# Patient Record
Sex: Female | Born: 1970 | Race: Black or African American | Hispanic: No | Marital: Married | Smoking: Never smoker
Health system: Southern US, Community
[De-identification: ages and names within clinical notes are randomized; demographics above are authoritative.]

## PROBLEM LIST (undated history)

## (undated) ENCOUNTER — Inpatient Hospital Stay (HOSPITAL_COMMUNITY): Payer: Self-pay

## (undated) DIAGNOSIS — E7800 Pure hypercholesterolemia, unspecified: Secondary | ICD-10-CM

## (undated) DIAGNOSIS — Z6841 Body Mass Index (BMI) 40.0 and over, adult: Secondary | ICD-10-CM

## (undated) DIAGNOSIS — L732 Hidradenitis suppurativa: Secondary | ICD-10-CM

## (undated) DIAGNOSIS — F419 Anxiety disorder, unspecified: Secondary | ICD-10-CM

## (undated) DIAGNOSIS — E559 Vitamin D deficiency, unspecified: Secondary | ICD-10-CM

## (undated) DIAGNOSIS — I10 Essential (primary) hypertension: Secondary | ICD-10-CM

## (undated) DIAGNOSIS — Z86018 Personal history of other benign neoplasm: Secondary | ICD-10-CM

## (undated) DIAGNOSIS — K439 Ventral hernia without obstruction or gangrene: Secondary | ICD-10-CM

## (undated) DIAGNOSIS — D219 Benign neoplasm of connective and other soft tissue, unspecified: Secondary | ICD-10-CM

## (undated) DIAGNOSIS — N3281 Overactive bladder: Secondary | ICD-10-CM

## (undated) DIAGNOSIS — E669 Obesity, unspecified: Secondary | ICD-10-CM

## (undated) DIAGNOSIS — D649 Anemia, unspecified: Secondary | ICD-10-CM

## (undated) HISTORY — DX: Ventral hernia without obstruction or gangrene: K43.9

## (undated) HISTORY — DX: Pure hypercholesterolemia, unspecified: E78.00

## (undated) HISTORY — PX: THROAT SURGERY: SHX803

## (undated) HISTORY — DX: Anxiety disorder, unspecified: F41.9

## (undated) HISTORY — DX: Overactive bladder: N32.81

## (undated) HISTORY — DX: Vitamin D deficiency, unspecified: E55.9

## (undated) HISTORY — DX: Obesity, unspecified: E66.9

## (undated) HISTORY — DX: Personal history of other benign neoplasm: Z86.018

## (undated) HISTORY — DX: Body Mass Index (BMI) 40.0 and over, adult: Z684

## (undated) HISTORY — DX: Hidradenitis suppurativa: L73.2

---

## 2010-03-08 ENCOUNTER — Emergency Department (HOSPITAL_COMMUNITY): Admission: EM | Admit: 2010-03-08 | Discharge: 2010-03-08 | Payer: Self-pay | Admitting: Emergency Medicine

## 2011-02-22 ENCOUNTER — Emergency Department (HOSPITAL_COMMUNITY)
Admission: EM | Admit: 2011-02-22 | Discharge: 2011-02-22 | Disposition: A | Discharge status: Home / Self Care | Payer: Self-pay | Attending: Emergency Medicine | Admitting: Emergency Medicine

## 2011-02-22 DIAGNOSIS — K625 Hemorrhage of anus and rectum: Secondary | ICD-10-CM | POA: Insufficient documentation

## 2011-02-22 DIAGNOSIS — I10 Essential (primary) hypertension: Secondary | ICD-10-CM | POA: Insufficient documentation

## 2011-02-22 DIAGNOSIS — K644 Residual hemorrhoidal skin tags: Secondary | ICD-10-CM | POA: Insufficient documentation

## 2011-02-22 LAB — OCCULT BLOOD, POC DEVICE: Fecal Occult Bld: NEGATIVE

## 2011-02-22 LAB — POCT I-STAT, CHEM 8
Creatinine, Ser: 0.7 mg/dL (ref 0.50–1.10)
HCT: 35 % — ABNORMAL LOW (ref 36.0–46.0)
Hemoglobin: 11.9 g/dL — ABNORMAL LOW (ref 12.0–15.0)
Potassium: 3.4 mEq/L — ABNORMAL LOW (ref 3.5–5.1)
Sodium: 138 mEq/L (ref 135–145)
TCO2: 24 mmol/L (ref 0–100)

## 2011-08-31 ENCOUNTER — Encounter (INDEPENDENT_AMBULATORY_CARE_PROVIDER_SITE_OTHER): Payer: BC Managed Care – PPO | Admitting: Obstetrics and Gynecology

## 2011-08-31 DIAGNOSIS — R21 Rash and other nonspecific skin eruption: Secondary | ICD-10-CM

## 2011-08-31 DIAGNOSIS — I10 Essential (primary) hypertension: Secondary | ICD-10-CM

## 2011-09-01 ENCOUNTER — Other Ambulatory Visit: Payer: Self-pay | Admitting: Obstetrics and Gynecology

## 2011-09-01 DIAGNOSIS — Z1231 Encounter for screening mammogram for malignant neoplasm of breast: Secondary | ICD-10-CM

## 2011-09-08 ENCOUNTER — Ambulatory Visit: Payer: BC Managed Care – PPO

## 2013-10-19 ENCOUNTER — Other Ambulatory Visit (HOSPITAL_COMMUNITY): Admission: RE | Admit: 2013-10-19 | Payer: BC Managed Care – PPO | Source: Ambulatory Visit | Admitting: Family Medicine

## 2013-10-19 ENCOUNTER — Other Ambulatory Visit: Payer: Self-pay | Admitting: Family Medicine

## 2013-10-19 DIAGNOSIS — Z124 Encounter for screening for malignant neoplasm of cervix: Secondary | ICD-10-CM

## 2013-11-10 ENCOUNTER — Inpatient Hospital Stay (HOSPITAL_COMMUNITY): Admit: 2013-11-10 | Payer: Self-pay

## 2014-05-08 ENCOUNTER — Encounter (HOSPITAL_COMMUNITY): Payer: Self-pay | Admitting: Emergency Medicine

## 2014-05-08 ENCOUNTER — Emergency Department (HOSPITAL_COMMUNITY)
Admission: EM | Admit: 2014-05-08 | Discharge: 2014-05-08 | Disposition: A | Discharge status: Home / Self Care | Payer: BC Managed Care – PPO | Attending: Emergency Medicine | Admitting: Emergency Medicine

## 2014-05-08 DIAGNOSIS — O039 Complete or unspecified spontaneous abortion without complication: Secondary | ICD-10-CM | POA: Diagnosis not present

## 2014-05-08 DIAGNOSIS — Z79899 Other long term (current) drug therapy: Secondary | ICD-10-CM | POA: Insufficient documentation

## 2014-05-08 DIAGNOSIS — Z862 Personal history of diseases of the blood and blood-forming organs and certain disorders involving the immune mechanism: Secondary | ICD-10-CM | POA: Diagnosis not present

## 2014-05-08 DIAGNOSIS — Z3A08 8 weeks gestation of pregnancy: Secondary | ICD-10-CM | POA: Diagnosis not present

## 2014-05-08 DIAGNOSIS — I10 Essential (primary) hypertension: Secondary | ICD-10-CM | POA: Insufficient documentation

## 2014-05-08 DIAGNOSIS — N939 Abnormal uterine and vaginal bleeding, unspecified: Secondary | ICD-10-CM | POA: Diagnosis present

## 2014-05-08 HISTORY — DX: Essential (primary) hypertension: I10

## 2014-05-08 HISTORY — DX: Anemia, unspecified: D64.9

## 2014-05-08 LAB — CBC WITH DIFFERENTIAL/PLATELET
BASOS ABS: 0 10*3/uL (ref 0.0–0.1)
BASOS PCT: 0 % (ref 0–1)
EOS PCT: 2 % (ref 0–5)
Eosinophils Absolute: 0.1 10*3/uL (ref 0.0–0.7)
HEMATOCRIT: 37.9 % (ref 36.0–46.0)
Hemoglobin: 12.4 g/dL (ref 12.0–15.0)
LYMPHS PCT: 27 % (ref 12–46)
Lymphs Abs: 1.2 10*3/uL (ref 0.7–4.0)
MCH: 25.9 pg — ABNORMAL LOW (ref 26.0–34.0)
MCHC: 32.7 g/dL (ref 30.0–36.0)
MCV: 79.1 fL (ref 78.0–100.0)
MONO ABS: 0.4 10*3/uL (ref 0.1–1.0)
MONOS PCT: 9 % (ref 3–12)
Neutro Abs: 2.9 10*3/uL (ref 1.7–7.7)
Neutrophils Relative %: 62 % (ref 43–77)
Platelets: 318 10*3/uL (ref 150–400)
RBC: 4.79 MIL/uL (ref 3.87–5.11)
RDW: 14.7 % (ref 11.5–15.5)
WBC: 4.6 10*3/uL (ref 4.0–10.5)

## 2014-05-08 LAB — BASIC METABOLIC PANEL
Anion gap: 16 — ABNORMAL HIGH (ref 5–15)
BUN: 12 mg/dL (ref 6–23)
CHLORIDE: 99 meq/L (ref 96–112)
CO2: 21 mEq/L (ref 19–32)
CREATININE: 0.85 mg/dL (ref 0.50–1.10)
Calcium: 9.8 mg/dL (ref 8.4–10.5)
GFR calc non Af Amer: 83 mL/min — ABNORMAL LOW (ref >90)
GLUCOSE: 118 mg/dL — AB (ref 70–99)
Potassium: 4 mEq/L (ref 3.7–5.3)
Sodium: 136 mEq/L — ABNORMAL LOW (ref 137–147)

## 2014-05-08 LAB — HCG, QUANTITATIVE, PREGNANCY: hCG, Beta Chain, Quant, S: 558 m[IU]/mL — ABNORMAL HIGH (ref <5)

## 2014-05-08 MED ORDER — NAPROXEN 500 MG PO TABS
500.0000 mg | ORAL_TABLET | Freq: Once | ORAL | Status: AC
  Administered 2014-05-08: 500 mg via ORAL
  Filled 2014-05-08: qty 1

## 2014-05-08 MED ORDER — ONDANSETRON 8 MG PO TBDP
8.0000 mg | ORAL_TABLET | Freq: Once | ORAL | Status: AC
  Administered 2014-05-08: 8 mg via ORAL
  Filled 2014-05-08: qty 1

## 2014-05-08 MED ORDER — HYDROMORPHONE HCL 1 MG/ML IJ SOLN
1.0000 mg | Freq: Once | INTRAMUSCULAR | Status: AC
  Administered 2014-05-08: 1 mg via INTRAMUSCULAR
  Filled 2014-05-08: qty 1

## 2014-05-08 MED ORDER — OXYCODONE-ACETAMINOPHEN 5-325 MG PO TABS
2.0000 | ORAL_TABLET | Freq: Once | ORAL | Status: AC
  Administered 2014-05-08: 2 via ORAL
  Filled 2014-05-08: qty 2

## 2014-05-08 MED ORDER — NAPROXEN 500 MG PO TABS: 500.0000 mg | ORAL_TABLET | Freq: Two times a day (BID) | ORAL | Status: DC

## 2014-05-08 MED ORDER — OXYCODONE-ACETAMINOPHEN 5-325 MG PO TABS: 2.0000 | ORAL_TABLET | ORAL | Status: DC | PRN

## 2014-05-08 MED ORDER — ONDANSETRON 8 MG PO TBDP: 8.0000 mg | ORAL_TABLET | Freq: Three times a day (TID) | ORAL | Status: DC | PRN

## 2014-05-08 NOTE — ED Notes (Signed)
Patient states she was seen Saturday and was told she was having a miscarriage. Patient states she began having heavy bleeding and passing tissue. Patient states the bleeding has been increasing and now she is having severe abd cramping. Patient states she took 800mg  ibuprofen at 0300 and also drank Miralax for constipation. Patient is A&O, NAD noted. Patient husband at bedside.

## 2014-05-08 NOTE — ED Provider Notes (Signed)
CSN: [id number]     Arrival date & time 05/08/14  0353 History   First MD Initiated Contact with Patient 05/08/14 0424     Chief Complaint  Patient presents with  . Vaginal Bleeding     (Consider location/radiation/quality/duration/timing/severity/associated sxs/prior Treatment) HPI 43 year old female presents to the emergency department with complaint of vaginal bleeding and lower abdominal cramping.  Patient was seen Saturday, told she was having a miscarriage, had a ultrasound showing a 8 week 5 day IUP with no heart activity at [facility].  She has been having bleeding for the last 3 days, tonight much heavier.  She was seen at the clinic on Monday and had hCG done.  No fevers or chills.  This is her first pregnancy.  She is not bleeding more than a pad an hour Past Medical History  Diagnosis Date  . Hypertension   . Anemia    History reviewed. No pertinent past surgical history. No family history on file. History  Substance Use Topics  . Smoking status: Never Smoker   . Smokeless tobacco: Not on file  . Alcohol Use: No   OB History    Gravida Para Term Preterm AB TAB SAB Ectopic Multiple Living   1              Review of Systems  See History of Present Illness; otherwise all other systems are reviewed and negative   Allergies  Review of patient's allergies indicates no known allergies.  Home Medications   Prior to Admission medications   Medication Sig Start Date End Date Taking? Authorizing Provider  labetalol (NORMODYNE) 200 MG tablet Take 1 tablet by mouth 2 (two) times daily. 05/04/14   Historical Provider, MD   BP 156/84 mmHg  Pulse 87  Temp(Src) 97.7 F (36.5 C) (Oral)  Resp 20  SpO2 98%  LMP  Physical Exam  Constitutional: She is oriented to person, place, and time. She appears well-developed and well-nourished.  HENT:  Head: Normocephalic and atraumatic.  Nose: Nose normal.  Mouth/Throat: Oropharynx is clear and moist.  Eyes: Conjunctivae and  EOM are normal. Pupils are equal, round, and reactive to light.  Neck: Normal range of motion. Neck supple. No JVD present. No tracheal deviation present. No thyromegaly present.  Cardiovascular: Normal rate, regular rhythm, normal heart sounds and intact distal pulses.  Exam reveals no gallop and no friction rub.   No murmur heard. Pulmonary/Chest: Effort normal and breath sounds normal. No stridor. No respiratory distress. She has no wheezes. She has no rales. She exhibits no tenderness.  Abdominal: Soft. Bowel sounds are normal. She exhibits no distension and no mass. There is no tenderness. There is no rebound and no guarding.  Genitourinary:  External genitalia within normal limits Vagina with bloody discharge Cervix  Os open, blood, tissue noted in os negative for cervical motion tenderness Adnexa palpated, no masses or negative for tenderness noted Bladder palpated positive for tenderness Uterus palpated no masses but positive for tenderness    Musculoskeletal: Normal range of motion. She exhibits no edema or tenderness.  Lymphadenopathy:    She has no cervical adenopathy.  Neurological: She is alert and oriented to person, place, and time. She displays normal reflexes. She exhibits normal muscle tone. Coordination normal.  Skin: Skin is warm and dry. No rash noted. No erythema. No pallor.  Psychiatric: She has a normal mood and affect. Her behavior is normal. Judgment and thought content normal.  Nursing note and vitals reviewed.   ED  Course  Procedures (including critical care time) Labs Review Labs Reviewed  CBC WITH DIFFERENTIAL - Abnormal; Notable for the following:    MCH 25.9 (*)    All other components within normal limits  HCG, QUANTITATIVE, PREGNANCY - Abnormal; Notable for the following:    hCG, Beta Chain, Quant, S 558 (*)    All other components within normal limits  BASIC METABOLIC PANEL - Abnormal; Notable for the following:    Sodium 136 (*)    Glucose, Bld  118 (*)    GFR calc non Af Amer 83 (*)    Anion gap 16 (*)    All other components within normal limits    Imaging Review No results found.   EKG Interpretation None      MDM   Final diagnoses:  Miscarriage    [name] with miscarriage.  She is O+ blood type.  HCG is 558, do not have values from [facility] [place] [facility].  Patient has known IUP.  We'll can try to control pain, however will need follow back up with OB/GYN.    Kalman Drape, MD 05/08/14 [phone]

## 2014-05-08 NOTE — ED Notes (Signed)
Pelvic exam ready. Lab at bedside.

## 2014-05-08 NOTE — Discharge Instructions (Signed)
Miscarriage A miscarriage is the sudden loss of an unborn baby (fetus) before the 20th week of pregnancy. Most miscarriages happen in the first 3 months of pregnancy. Sometimes, it happens before a woman even knows she is pregnant. A miscarriage is also called a "spontaneous miscarriage" or "early pregnancy loss." Having a miscarriage can be an emotional experience. Talk with your caregiver about any questions you may have about miscarrying, the grieving process, and your future pregnancy plans. CAUSES   Problems with the fetal chromosomes that make it impossible for the baby to develop normally. Problems with the baby's genes or chromosomes are most often the result of errors that occur, by chance, as the embryo divides and grows. The problems are not inherited from the parents.  Infection of the cervix or uterus.   Hormone problems.   Problems with the cervix, such as having an incompetent cervix. This is when the tissue in the cervix is not strong enough to hold the pregnancy.   Problems with the uterus, such as an abnormally shaped uterus, uterine fibroids, or congenital abnormalities.   Certain medical conditions.   Smoking, drinking alcohol, or taking illegal drugs.   Trauma.  Often, the cause of a miscarriage is unknown.  SYMPTOMS   Vaginal bleeding or spotting, with or without cramps or pain.  Pain or cramping in the abdomen or lower back.  Passing fluid, tissue, or blood clots from the vagina. DIAGNOSIS  Your caregiver will perform a physical exam. You may also have an ultrasound to confirm the miscarriage. Blood or urine tests may also be ordered. TREATMENT   Sometimes, treatment is not necessary if you naturally pass all the fetal tissue that was in the uterus. If some of the fetus or placenta remains in the body (incomplete miscarriage), tissue left behind may become infected and must be removed. Usually, a dilation and curettage (D and C) procedure is performed.  During a D and C procedure, the cervix is widened (dilated) and any remaining fetal or placental tissue is gently removed from the uterus.  Antibiotic medicines are prescribed if there is an infection. Other medicines may be given to reduce the size of the uterus (contract) if there is a lot of bleeding.  If you have Rh negative blood and your baby was Rh positive, you will need a Rh immunoglobulin shot. This shot will protect any future baby from having Rh blood problems in future pregnancies. HOME CARE INSTRUCTIONS   Your caregiver may order bed rest or may allow you to continue light activity. Resume activity as directed by your caregiver.  Have someone help with home and family responsibilities during this time.   Keep track of the number of sanitary pads you use each day and how soaked (saturated) they are. Write down this information.   Do not use tampons. Do not douche or have sexual intercourse until approved by your caregiver.   Only take over-the-counter or prescription medicines for pain or discomfort as directed by your caregiver.   Do not take aspirin. Aspirin can cause bleeding.   Keep all follow-up appointments with your caregiver.   If you or your partner have problems with grieving, talk to your caregiver or seek counseling to help cope with the pregnancy loss. Allow enough time to grieve before trying to get pregnant again.  SEEK IMMEDIATE MEDICAL CARE IF:   You have severe cramps or pain in your back or abdomen.  You have a fever.  You pass large blood clots (walnut-sized   or larger) ortissue from your vagina. Save any tissue for your caregiver to inspect.   Your bleeding increases.   You have a thick, bad-smelling vaginal discharge.  You become lightheaded, weak, or you faint.   You have chills.  MAKE SURE YOU:  Understand these instructions.  Will watch your condition.  Will get help right away if you are not doing well or get  worse. Document Released: 12/08/2000 Document Revised: 10/09/2012 Document Reviewed: 08/03/2011 [employer] Patient Information 2015 [employer]. This information is not intended to replace advice given to you by your health care provider. Make sure you discuss any questions you have with your health care provider.  

## 2014-05-08 NOTE — ED Notes (Signed)
Pt presents with severe low mid abd cramping onset tonight, pt states she had + preg with u/s 5 days ago showing [redacted]w[redacted]d IUP with no heart activity at [place] states she began bleeding 3 days ago, heavy with clots, tonight bleeding increased with severe cramping.

## 2014-05-08 NOTE — ED Notes (Signed)
Patient given hot pack for comfort

## 2014-05-08 NOTE — ED Notes (Signed)
Patient given gingerale and saltines. Advised I will return in @ 15 minutes for pain medication administration.

## 2014-05-08 NOTE — ED Notes (Signed)
Patient began to vomit, c/o nausea. MD notified, orders received.

## 2014-05-09 ENCOUNTER — Inpatient Hospital Stay (HOSPITAL_COMMUNITY)
Admission: AD | Admit: 2014-05-09 | Discharge: 2014-05-09 | Disposition: A | Discharge status: Home / Self Care | Payer: BC Managed Care – PPO | Source: Ambulatory Visit | Attending: Obstetrics & Gynecology | Admitting: Obstetrics & Gynecology

## 2014-05-09 ENCOUNTER — Encounter (HOSPITAL_COMMUNITY): Payer: Self-pay | Admitting: *Deleted

## 2014-05-09 DIAGNOSIS — Z3A08 8 weeks gestation of pregnancy: Secondary | ICD-10-CM | POA: Diagnosis not present

## 2014-05-09 DIAGNOSIS — Z8759 Personal history of other complications of pregnancy, childbirth and the puerperium: Secondary | ICD-10-CM

## 2014-05-09 DIAGNOSIS — O039 Complete or unspecified spontaneous abortion without complication: Secondary | ICD-10-CM | POA: Insufficient documentation

## 2014-05-09 LAB — HCG, QUANTITATIVE, PREGNANCY: hCG, Beta Chain, Quant, S: 248 m[IU]/mL — ABNORMAL HIGH (ref <5)

## 2014-05-09 MED ORDER — BUTORPHANOL TARTRATE 1 MG/ML IJ SOLN
1.0000 mg | Freq: Once | INTRAMUSCULAR | Status: DC
  Filled 2014-05-09: qty 1

## 2014-05-09 MED ORDER — BUTORPHANOL TARTRATE 1 MG/ML IJ SOLN
1.0000 mg | Freq: Once | INTRAMUSCULAR | Status: AC
  Administered 2014-05-09: 1 mg via INTRAMUSCULAR

## 2014-05-09 MED ORDER — BUTORPHANOL TARTRATE 1 MG/ML IJ SOLN
1.0000 mg | Freq: Once | INTRAMUSCULAR | Status: AC
  Administered 2014-05-09: 1 mg via INTRAMUSCULAR
  Filled 2014-05-09: qty 1

## 2014-05-09 NOTE — MAU Note (Signed)
From lobby with pain and bleeding, fetal growth stopped @ 8 weeks and now 12 wks per pt

## 2014-05-09 NOTE — Discharge Instructions (Signed)
Miscarriage A miscarriage is the sudden loss of an unborn baby (fetus) before the 20th week of pregnancy. Most miscarriages happen in the first 3 months of pregnancy. Sometimes, it happens before a woman even knows she is pregnant. A miscarriage is also called a "spontaneous miscarriage" or "early pregnancy loss." Having a miscarriage can be an emotional experience. Talk with your caregiver about any questions you may have about miscarrying, the grieving process, and your future pregnancy plans. CAUSES   Problems with the fetal chromosomes that make it impossible for the baby to develop normally. Problems with the baby's genes or chromosomes are most often the result of errors that occur, by chance, as the embryo divides and grows. The problems are not inherited from the parents.  Infection of the cervix or uterus.   Hormone problems.   Problems with the cervix, such as having an incompetent cervix. This is when the tissue in the cervix is not strong enough to hold the pregnancy.   Problems with the uterus, such as an abnormally shaped uterus, uterine fibroids, or congenital abnormalities.   Certain medical conditions.   Smoking, drinking alcohol, or taking illegal drugs.   Trauma.  Often, the cause of a miscarriage is unknown.  SYMPTOMS   Vaginal bleeding or spotting, with or without cramps or pain.  Pain or cramping in the abdomen or lower back.  Passing fluid, tissue, or blood clots from the vagina. DIAGNOSIS  Your caregiver will perform a physical exam. You may also have an ultrasound to confirm the miscarriage. Blood or urine tests may also be ordered. TREATMENT   Sometimes, treatment is not necessary if you naturally pass all the fetal tissue that was in the uterus. If some of the fetus or placenta remains in the body (incomplete miscarriage), tissue left behind may become infected and must be removed. Usually, a dilation and curettage (D and C) procedure is performed.  During a D and C procedure, the cervix is widened (dilated) and any remaining fetal or placental tissue is gently removed from the uterus.  Antibiotic medicines are prescribed if there is an infection. Other medicines may be given to reduce the size of the uterus (contract) if there is a lot of bleeding.  If you have Rh negative blood and your baby was Rh positive, you will need a Rh immunoglobulin shot. This shot will protect any future baby from having Rh blood problems in future pregnancies. HOME CARE INSTRUCTIONS   Your caregiver may order bed rest or may allow you to continue light activity. Resume activity as directed by your caregiver.  Have someone help with home and family responsibilities during this time.   Keep track of the number of sanitary pads you use each day and how soaked (saturated) they are. Write down this information.   Do not use tampons. Do not douche or have sexual intercourse until approved by your caregiver.   Only take over-the-counter or prescription medicines for pain or discomfort as directed by your caregiver.   Do not take aspirin. Aspirin can cause bleeding.   Keep all follow-up appointments with your caregiver.   If you or your partner have problems with grieving, talk to your caregiver or seek counseling to help cope with the pregnancy loss. Allow enough time to grieve before trying to get pregnant again.  SEEK IMMEDIATE MEDICAL CARE IF:   You have severe cramps or pain in your back or abdomen.  You have a fever.  You pass large blood clots (walnut-sized  or larger) ortissue from your vagina. Save any tissue for your caregiver to inspect.   Your bleeding increases.   You have a thick, bad-smelling vaginal discharge.  You become lightheaded, weak, or you faint.   You have chills.  MAKE SURE YOU:  Understand these instructions.  Will watch your condition.  Will get help right away if you are not doing well or get  worse. Document Released: 12/08/2000 Document Revised: 10/09/2012 Document Reviewed: 08/03/2011 [facility] Patient Information 2015 [place], [place]. This information is not intended to replace advice given to you by your health care provider. Make sure you discuss any questions you have with your health care provider. Abdominal Pain Many things can cause abdominal pain. Usually, abdominal pain is not caused by a disease and will improve without treatment. It can often be observed and treated at home. Your health care provider will do a physical exam and possibly order blood tests and X-rays to help determine the seriousness of your pain. However, in many cases, more time must pass before a clear cause of the pain can be found. Before that point, your health care provider may not know if you need more testing or further treatment. HOME CARE INSTRUCTIONS  Monitor your abdominal pain for any changes. The following actions may help to alleviate any discomfort you are experiencing:  Only take over-the-counter or prescription medicines as directed by your health care provider.  Do not take laxatives unless directed to do so by your health care provider.  Try a clear liquid diet (broth, tea, or water) as directed by your health care provider. Slowly move to a bland diet as tolerated. SEEK MEDICAL CARE IF:  You have unexplained abdominal pain.  You have abdominal pain associated with nausea or diarrhea.  You have pain when you urinate or have a bowel movement.  You experience abdominal pain that wakes you in the night.  You have abdominal pain that is worsened or improved by eating food.  You have abdominal pain that is worsened with eating fatty foods.  You have a fever. SEEK IMMEDIATE MEDICAL CARE IF:   Your pain does not go away within 2 hours.  You keep throwing up (vomiting).  Your pain is felt only in portions of the abdomen, such as the right side or the left lower portion of the  abdomen.  You pass bloody or black tarry stools. MAKE SURE YOU:  Understand these instructions.   Will watch your condition.   Will get help right away if you are not doing well or get worse.  Document Released: 03/24/2005 Document Revised: 06/19/2013 Document Reviewed: 02/21/2013 [facility] Patient Information 2015 [place], [place]. This information is not intended to replace advice given to you by your health care provider. Make sure you discuss any questions you have with your health care provider.

## 2014-05-09 NOTE — MAU Provider Note (Signed)
MAU Addendum Note  Results for orders placed or performed during the hospital encounter of 05/09/14 (from the past 24 hour(s))  hCG, quantitative, pregnancy     Status: Abnormal   Collection Time: 05/09/14 12:30 PM  Result Value Ref Range   hCG, Beta Chain, Quant, S 248 (H) <5 mIU/mL     Plan: -continue percocet PRN -Discussed need to follow up in office for repeat quant  -Bleeding Precautions -Encouraged to call if any questions or concerns arise prior to next scheduled office visit.  -Discharged to home in stable condition -Give heart string info   Shanan Fitzpatrick, CNM, MSN 05/09/2014. 1:27 PM

## 2014-05-09 NOTE — MAU Note (Signed)
Miscarrying; states that she would be 13 weeks today but fetus stopped growing @ 8weeks;

## 2014-05-09 NOTE — MAU Provider Note (Signed)
Mariah Marks is a 43 y.o. G1P0 at 8 weeks by [place] with severe abd pain, vb and clots.  Quant on 05/06/14 was 838, on 05/07/14 it was 558.    History     There are no active problems to display for this patient.   Chief Complaint  Patient presents with  . Abdominal Pain   HPI  OB History    Gravida Para Term Preterm AB TAB SAB Ectopic Multiple Living   1               Past Medical History  Diagnosis Date  . Hypertension   . Anemia     No past surgical history on file.  No family history on file.  History  Substance Use Topics  . Smoking status: Never Smoker   . Smokeless tobacco: Not on file  . Alcohol Use: No    Allergies: No Known Allergies  Prescriptions prior to admission  Medication Sig Dispense Refill Last Dose  . ferrous sulfate 325 (65 FE) MG tablet Take 325 mg by mouth daily with breakfast.   Past Week at Unknown time  . ibuprofen (ADVIL,MOTRIN) 800 MG tablet Take 800 mg by mouth every 8 (eight) hours as needed for moderate pain.   05/07/2014 at Unknown time  . labetalol (NORMODYNE) 200 MG tablet Take 1 tablet by mouth 2 (two) times daily.   05/07/2014 at 0700-0800  . naproxen (NAPROSYN) 500 MG tablet Take 1 tablet (500 mg total) by mouth 2 (two) times daily. 30 tablet 0   . ondansetron (ZOFRAN ODT) 8 MG disintegrating tablet Take 1 tablet (8 mg total) by mouth every 8 (eight) hours as needed for nausea or vomiting. 20 tablet 0   . oxyCODONE-acetaminophen (PERCOCET/ROXICET) 5-325 MG per tablet Take 2 tablets by mouth every 4 (four) hours as needed for severe pain. 20 tablet 0     ROS See HPI above, all other systems are negative  Physical Exam   Blood pressure 130/55, pulse 100, temperature 97.8 F (36.6 C), resp. rate 24, height 5\' 3"  (1.6 m), weight 250 lb (113.399 kg), SpO2 100 %.  Physical Exam Ext:  WNL ABD: Soft, non tender to palpation, no rebound or guarding SVE: 1cm   ED Course  Assessment: IUP loss at   8weeks   Plan: BHCG stadol   Shizue Kaseman, CNM, MSN 05/09/2014. 12:22 PM

## 2014-05-21 NOTE — H&P (Signed)
Mariah Marks is an 43 y.o. female. Pt presented to office yesterday after having an SAB a couple of weeks ago and c/o persistent spotting and intermittent cramping.  She had been seen at [facility] and [facility].  Eval in office revealed slight bleeding with brown discharge.  U/S was ordered for the following day and quant done as well.  U/S today 11/24 revealed retained POCs with flow to heterogeneous endometrium.  Recs reviewed and pt would like to proceed with D&C.  Pertinent Gynecological History: S/p SAB, pap neg 09/2013  Menstrual History:  No LMP recorded. Patient is pregnant.    Past Medical History  Diagnosis Date  . Hypertension   . Anemia     Past Surgical History  Procedure Laterality Date  . Throat surgery      No family history on file.  Social History:  reports that she has never smoked. She does not have any smokeless tobacco history on file. She reports that she does not drink alcohol or use illicit drugs.  Allergies: No Known Allergies  No prescriptions prior to admission    ROS Non-contributory  There were no vitals taken for this visit. Physical Exam  Lungs CTA CV RRR Abd soft, NT VE - uterus nl, NT with brown discharge, no palpable masses  No results found for this or any previous visit (from the past 24 hour(s)).  No results found.  Assessment/Plan: S/p SAB around 05/08/14 with retained POCs scheduled for suction D&C 05/22/14 with AVS. Blood type per pt O+.  Questions answered.  Mariah Marks 05/21/2014, 5:45 PM

## 2014-05-22 ENCOUNTER — Ambulatory Visit (HOSPITAL_COMMUNITY)
Admission: RE | Admit: 2014-05-22 | Discharge: 2014-05-22 | Disposition: A | Discharge status: Home / Self Care | Payer: BC Managed Care – PPO | Source: Ambulatory Visit | Attending: Obstetrics and Gynecology | Admitting: Obstetrics and Gynecology

## 2014-05-22 ENCOUNTER — Ambulatory Visit (HOSPITAL_COMMUNITY): Payer: BC Managed Care – PPO | Admitting: Anesthesiology

## 2014-05-22 ENCOUNTER — Encounter (HOSPITAL_COMMUNITY): Payer: Self-pay | Admitting: Anesthesiology

## 2014-05-22 ENCOUNTER — Encounter (HOSPITAL_COMMUNITY)
Admission: RE | Disposition: A | Discharge status: Home / Self Care | Payer: Self-pay | Source: Ambulatory Visit | Attending: Obstetrics and Gynecology

## 2014-05-22 DIAGNOSIS — O034 Incomplete spontaneous abortion without complication: Secondary | ICD-10-CM | POA: Insufficient documentation

## 2014-05-22 DIAGNOSIS — Z6841 Body Mass Index (BMI) 40.0 and over, adult: Secondary | ICD-10-CM | POA: Insufficient documentation

## 2014-05-22 DIAGNOSIS — D649 Anemia, unspecified: Secondary | ICD-10-CM | POA: Insufficient documentation

## 2014-05-22 DIAGNOSIS — I10 Essential (primary) hypertension: Secondary | ICD-10-CM | POA: Diagnosis not present

## 2014-05-22 HISTORY — PX: DILATION AND EVACUATION: SHX1459

## 2014-05-22 LAB — CBC
HEMATOCRIT: 34.1 % — AB (ref 36.0–46.0)
Hemoglobin: 11.1 g/dL — ABNORMAL LOW (ref 12.0–15.0)
MCH: 26.3 pg (ref 26.0–34.0)
MCHC: 32.6 g/dL (ref 30.0–36.0)
MCV: 80.8 fL (ref 78.0–100.0)
PLATELETS: 338 10*3/uL (ref 150–400)
RBC: 4.22 MIL/uL (ref 3.87–5.11)
RDW: 14.4 % (ref 11.5–15.5)
WBC: 4.5 10*3/uL (ref 4.0–10.5)

## 2014-05-22 SURGERY — DILATION AND EVACUATION, UTERUS
Anesthesia: Monitor Anesthesia Care | Site: Vagina

## 2014-05-22 MED ORDER — DEXAMETHASONE SODIUM PHOSPHATE 10 MG/ML IJ SOLN
INTRAMUSCULAR | Status: DC | PRN
  Administered 2014-05-22: 4 mg via INTRAVENOUS

## 2014-05-22 MED ORDER — OXYCODONE HCL 5 MG PO TABS: 5.0000 mg | ORAL_TABLET | Freq: Once | ORAL | Status: DC | PRN

## 2014-05-22 MED ORDER — ONDANSETRON HCL 4 MG/2ML IJ SOLN
INTRAMUSCULAR | Status: DC | PRN
  Administered 2014-05-22: 4 mg via INTRAVENOUS

## 2014-05-22 MED ORDER — GLYCOPYRROLATE 0.2 MG/ML IJ SOLN
INTRAMUSCULAR | Status: DC | PRN
  Administered 2014-05-22: 0.2 mg via INTRAVENOUS

## 2014-05-22 MED ORDER — SUCCINYLCHOLINE CHLORIDE 20 MG/ML IJ SOLN
INTRAMUSCULAR | Status: DC | PRN
  Administered 2014-05-22: 20 mg via INTRAVENOUS

## 2014-05-22 MED ORDER — FENTANYL CITRATE 0.05 MG/ML IJ SOLN
INTRAMUSCULAR | Status: AC
  Filled 2014-05-22: qty 2

## 2014-05-22 MED ORDER — LIDOCAINE HCL (CARDIAC) 20 MG/ML IV SOLN
INTRAVENOUS | Status: AC
  Filled 2014-05-22: qty 5

## 2014-05-22 MED ORDER — OXYCODONE-ACETAMINOPHEN 5-325 MG PO TABS: 1.0000 | ORAL_TABLET | ORAL | Status: DC | PRN

## 2014-05-22 MED ORDER — KETOROLAC TROMETHAMINE 30 MG/ML IJ SOLN
INTRAMUSCULAR | Status: AC
  Filled 2014-05-22: qty 1

## 2014-05-22 MED ORDER — BUPIVACAINE-EPINEPHRINE 0.5% -1:200000 IJ SOLN
INTRAMUSCULAR | Status: DC | PRN
  Administered 2014-05-22: 10 mL

## 2014-05-22 MED ORDER — LIDOCAINE HCL (CARDIAC) 20 MG/ML IV SOLN
INTRAVENOUS | Status: DC | PRN
  Administered 2014-05-22: 100 mg via INTRAVENOUS

## 2014-05-22 MED ORDER — SCOPOLAMINE 1 MG/3DAYS TD PT72
MEDICATED_PATCH | TRANSDERMAL | Status: AC
  Administered 2014-05-22: 1.5 mg via TRANSDERMAL
  Filled 2014-05-22: qty 1

## 2014-05-22 MED ORDER — MIDAZOLAM HCL 2 MG/2ML IJ SOLN
INTRAMUSCULAR | Status: AC
  Filled 2014-05-22: qty 2

## 2014-05-22 MED ORDER — MEPERIDINE HCL 25 MG/ML IJ SOLN: 6.2500 mg | INTRAMUSCULAR | Status: DC | PRN

## 2014-05-22 MED ORDER — PROPOFOL 10 MG/ML IV EMUL
INTRAVENOUS | Status: AC
  Filled 2014-05-22: qty 20

## 2014-05-22 MED ORDER — KETOROLAC TROMETHAMINE 30 MG/ML IJ SOLN
INTRAMUSCULAR | Status: DC | PRN
  Administered 2014-05-22: 30 mg via INTRAMUSCULAR
  Administered 2014-05-22: 30 mg via INTRAVENOUS

## 2014-05-22 MED ORDER — DEXAMETHASONE SODIUM PHOSPHATE 4 MG/ML IJ SOLN
INTRAMUSCULAR | Status: AC
  Filled 2014-05-22: qty 1

## 2014-05-22 MED ORDER — IBUPROFEN 800 MG PO TABS: 800.0000 mg | ORAL_TABLET | Freq: Three times a day (TID) | ORAL | Status: DC | PRN

## 2014-05-22 MED ORDER — SCOPOLAMINE 1 MG/3DAYS TD PT72
1.0000 | MEDICATED_PATCH | Freq: Once | TRANSDERMAL | Status: DC
  Administered 2014-05-22: 1.5 mg via TRANSDERMAL

## 2014-05-22 MED ORDER — BUPIVACAINE-EPINEPHRINE (PF) 0.5% -1:200000 IJ SOLN
INTRAMUSCULAR | Status: AC
  Filled 2014-05-22: qty 30

## 2014-05-22 MED ORDER — PROPOFOL 10 MG/ML IV EMUL
INTRAVENOUS | Status: DC | PRN
Start: 2014-05-22 — End: 2014-05-22
  Administered 2014-05-22: 50 mg via INTRAVENOUS
  Administered 2014-05-22: 150 mg via INTRAVENOUS

## 2014-05-22 MED ORDER — LACTATED RINGERS IV SOLN
INTRAVENOUS | Status: DC
  Administered 2014-05-22 (×2): via INTRAVENOUS

## 2014-05-22 MED ORDER — FENTANYL CITRATE 0.05 MG/ML IJ SOLN: 25.0000 ug | INTRAMUSCULAR | Status: DC | PRN

## 2014-05-22 MED ORDER — METOCLOPRAMIDE HCL 5 MG/ML IJ SOLN: 10.0000 mg | Freq: Once | INTRAMUSCULAR | Status: DC | PRN

## 2014-05-22 MED ORDER — FENTANYL CITRATE 0.05 MG/ML IJ SOLN
INTRAMUSCULAR | Status: DC | PRN
  Administered 2014-05-22: 100 ug via INTRAVENOUS
  Administered 2014-05-22: 50 ug via INTRAVENOUS

## 2014-05-22 MED ORDER — LABETALOL HCL 100 MG PO TABS
100.0000 mg | ORAL_TABLET | Freq: Once | ORAL | Status: AC
  Administered 2014-05-22: 100 mg via ORAL
  Filled 2014-05-22: qty 1

## 2014-05-22 MED ORDER — ONDANSETRON HCL 4 MG/2ML IJ SOLN
INTRAMUSCULAR | Status: AC
  Filled 2014-05-22: qty 2

## 2014-05-22 MED ORDER — FENTANYL CITRATE 0.05 MG/ML IJ SOLN
INTRAMUSCULAR | Status: AC
  Filled 2014-05-22: qty 5

## 2014-05-22 MED ORDER — MIDAZOLAM HCL 2 MG/2ML IJ SOLN
INTRAMUSCULAR | Status: DC | PRN
Start: 2014-05-22 — End: 2014-05-22
  Administered 2014-05-22: 2 mg via INTRAVENOUS

## 2014-05-22 MED ORDER — KETOROLAC TROMETHAMINE 30 MG/ML IJ SOLN
INTRAMUSCULAR | Status: AC
  Filled 2014-05-22: qty 2

## 2014-05-22 MED ORDER — OXYCODONE HCL 5 MG/5ML PO SOLN: 5.0000 mg | Freq: Once | ORAL | Status: DC | PRN

## 2014-05-22 SURGICAL SUPPLY — 18 items
CATH ROBINSON RED A/P 16FR (CATHETERS) ×3 IMPLANT
CLOTH BEACON ORANGE TIMEOUT ST (SAFETY) ×3 IMPLANT
DECANTER SPIKE VIAL GLASS SM (MISCELLANEOUS) ×1 IMPLANT
GLOVE BIOGEL PI IND STRL 8.5 (GLOVE) ×1 IMPLANT
GLOVE BIOGEL PI INDICATOR 8.5 (GLOVE) ×2
GLOVE ECLIPSE 8.0 STRL XLNG CF (GLOVE) ×6 IMPLANT
GOWN STRL REUS W/TWL LRG LVL3 (GOWN DISPOSABLE) ×6 IMPLANT
KIT BERKELEY 1ST TRIMESTER 3/8 (MISCELLANEOUS) ×3 IMPLANT
NS IRRIG 1000ML POUR BTL (IV SOLUTION) ×3 IMPLANT
PACK VAGINAL MINOR WOMEN LF (CUSTOM PROCEDURE TRAY) ×3 IMPLANT
PAD OB MATERNITY 4.3X12.25 (PERSONAL CARE ITEMS) ×3 IMPLANT
PAD PREP 24X48 CUFFED NSTRL (MISCELLANEOUS) ×3 IMPLANT
SET BERKELEY SUCTION TUBING (SUCTIONS) ×3 IMPLANT
TOWEL OR 17X24 6PK STRL BLUE (TOWEL DISPOSABLE) ×6 IMPLANT
VACURETTE 10 RIGID CVD (CANNULA) IMPLANT
VACURETTE 7MM CVD STRL WRAP (CANNULA) IMPLANT
VACURETTE 8 RIGID CVD (CANNULA) ×2 IMPLANT
VACURETTE 9 RIGID CVD (CANNULA) IMPLANT

## 2014-05-22 NOTE — Op Note (Signed)
OPERATIVE NOTE  Mariah Marks  DOB:    [birth year]  MRN:    [id number]  CSN:    [id number]  Date of Surgery:  05/22/2014  Preoperative Diagnosis:  Incomplete abortion in the first trimester  Postoperative Diagnosis:  Incomplete abortion in the first trimester  Procedure:  FIRST TRIMESTER SUCTION DILATATION AND CURETTAGE  Surgeon:  Gildardo Cranker, M.D.  Assistant:  None  Anesthetic:  General  Disposition:  The patient is a 43 y.o. year old female who presents with a nonviable gestation based on ultrasound and/or quantitative hCG values. She understands the indications for her surgical procedure as well as the alternative treatment options. She accepts the risk of, but not limited to, anesthetic complications, bleeding, infections, and possible damage to the surrounding organs.  Findings:  The patient's blood type is O+. A small amount of products of conception were removed from within a 6 weeks size uterus. No adnexal masses were appreciated.  Procedure:  The patient was taken to the operating room where a General anesthetic was given. The patient's abdomen, perineum, and vagina were prepped with Betadine. The bladder was drained of urine. The patient was sterilely draped. Examination under anesthesia was performed. A paracervical block was placed using 10 cc of half percent Marcaine with epinephrine. The uterus sounded to 9 centimeters. The cervix was gently dilated. The uterine cavity was evacuated using a size 8 suction curet. The cavity was then cleaned using a medium sharp curet. The cavity was felt to be clean at the end of our procedure. The estimated blood loss was 25 cc. The uterus was reexamined and was noted to be firm. Hemostasis was adequate. Sponge and needle counts were correct. The patient tolerated her but her procedure well. She was awakened from her anesthetic without difficulty. She was transported to the recovery room in stable condition. The  products of conception were sent to pathology.  Followup instructions:  The patient return to see Dr. Raphael Gibney in 2 weeks. She was given prescriptions for pain medications. She was given instructions for patients who've undergone the surgical procedure. She will call for questions or concerns.  Gildardo Cranker, M.D.

## 2014-05-22 NOTE — Anesthesia Postprocedure Evaluation (Signed)
  Anesthesia Post-op Note  Patient: [name]  Procedure(s) Performed: Procedure(s): DILATATION AND EVACUATION (N/A)  Patient Location: PACU  Anesthesia Type:General  Level of Consciousness: awake, alert  and oriented  Airway and Oxygen Therapy: Patient Spontanous Breathing  Post-op Pain: none  Post-op Assessment: Post-op Vital signs reviewed, Patient's Cardiovascular Status Stable, Respiratory Function Stable, Patent Airway, No signs of Nausea or vomiting and Pain level controlled  Post-op Vital Signs: Reviewed and stable  Last Vitals:  Filed Vitals:   05/22/14 1145  BP: 142/61  Pulse: 88  Temp: 36.9 C  Resp: 15    Complications: Patient had laryngospasm during prep with brief desaturation. Responded to IV succinylcholine and PPV via mask. LMA inserted and case converted to GA with LMA.

## 2014-05-22 NOTE — Transfer of Care (Signed)
Immediate Anesthesia Transfer of Care Note  Patient: Mariah Marks  Procedure(s) Performed: Procedure(s): DILATATION AND EVACUATION (N/A)  Patient Location: PACU  Anesthesia Type:General  Level of Consciousness: awake and alert   Airway & Oxygen Therapy: Patient Spontanous Breathing and Patient connected to nasal cannula oxygen  Post-op Assessment: Report given to PACU RN and Post -op Vital signs reviewed and stable  Post vital signs: Reviewed and stable  Complications: No apparent anesthesia complications

## 2014-05-22 NOTE — H&P (Signed)
The patient was interviewed and examined today.  The previously documented history and physical examination was reviewed. There are no changes. The operative procedure was reviewed. The risks and benefits were outlined again. The specific risks include, but are not limited to, anesthetic complications, bleeding, infections, and possible damage to the surrounding organs. The patient's questions were answered.  We are ready to proceed as outlined. The likelihood of the patient achieving the goals of this procedure is very likely.   BP 155/98 mmHg  Pulse 88  Temp(Src) 98.2 F (36.8 C) (Oral)  Resp 20  Ht 5\' 3"  (1.6 m)  Wt 250 lb (113.399 kg)  BMI 44.30 kg/m2  SpO2 100%  Results for orders placed or performed during the hospital encounter of 05/22/14 (from the past 24 hour(s))  CBC     Status: Abnormal   Collection Time: 05/22/14  9:31 AM  Result Value Ref Range   WBC 4.5 4.0 - 10.5 K/uL   RBC 4.22 3.87 - 5.11 MIL/uL   Hemoglobin 11.1 (L) 12.0 - 15.0 g/dL   HCT 34.1 (L) 36.0 - 46.0 %   MCV 80.8 78.0 - 100.0 fL   MCH 26.3 26.0 - 34.0 pg   MCHC 32.6 30.0 - 36.0 g/dL   RDW 14.4 11.5 - 15.5 %   Platelets 338 150 - 400 K/uL    Gildardo Cranker, M.D.

## 2014-05-22 NOTE — Progress Notes (Signed)
I provided spiritual, emotional and grief support to pt, who goes by the name, Mariah Marks, and her husband, [name].  They were beginning to make meaning out of their loss and drawing on their faith to help them in that process.    They received memory items as well as grief resource information.  [employer] Pager, [phone] 4:45 PM    05/22/14 1600  Clinical Encounter Type  Visited With Patient and family together  Visit Type Spiritual support  Referral From Nurse  Stress Factors  Patient Stress Factors Loss

## 2014-05-22 NOTE — Discharge Instructions (Signed)
Miscarriage A miscarriage is the loss of an unborn baby (fetus) before the 20th week of pregnancy. The cause is often unknown.  HOME CARE  You may need to stay in bed (bed rest), or you may be able to do light activity. Go about activity as told by your doctor.  Have help at home.  Write down how many pads you use each day. Write down how soaked they are.  Do not use tampons. Do not wash out your vagina (douche) or have sex (intercourse) until your doctor approves.  Only take medicine as told by your doctor.  Do not take aspirin.  Keep all doctor visits as told.  If you or your partner have problems with grieving, talk to your doctor. You can also try counseling. Give yourself time to grieve before trying to get pregnant again. GET HELP RIGHT AWAY IF:  You have bad cramps or pain in your back or belly (abdomen).  You have a fever.  You pass large clumps of blood (clots) from your vagina that are walnut-sized or larger. Save the clumps for your doctor to see.  You pass large amounts of tissue from your vagina. Save the tissue for your doctor to see.  You have thick, bad-smelling fluid (discharge) coming from the vagina.  You get lightheaded, weak, or you pass out (faint).  You have chills. MAKE SURE YOU:  Understand these instructions.  Will watch your condition.  Will get help right away if you are not doing well or get worse. Document Released: 09/06/2011 Document Reviewed: 09/06/2011 [facility] Patient Information 2015 [place]. This information is not intended to replace advice given to you by your health care provider. Make sure you discuss any questions you have with your health care provider. Dilation and Curettage or Vacuum Curettage, Care After These instructions give you information on caring for yourself after your procedure. Your doctor may also give you more specific instructions. Call your doctor if you have any problems or questions after your  procedure. HOME CARE  No Ibuprofen containing products (ie Advil, Aleve, Motrin, etc.) until after 5:00 pm tonight.   Do not drive for 24 hours.  Wait 1 week before doing any activities that wear you out.  Take your temperature 2 times a day for 4 days. Write it down. Tell your doctor if you have a fever.  Do not stand for a long time.  Do not lift, push, or pull anything over 10 pounds (4.5 kilograms).  Limit stair climbing to once or twice a day.  Rest often.  Continue with your usual diet.  Drink enough fluids to keep your pee (urine) clear or pale yellow.  If you have a hard time pooping (constipation), you may:  Take a medicine to help you go poop (laxative) as told by your doctor.  Eat more fruit and bran.  Drink more fluids.  Take showers, not baths, for as long as told by your doctor.  Do not swim or use a hot tub until your doctor says it is okay.  Have someone with you for 1-2 days after the procedure.  Keep all doctor visits. GET HELP IF:  You have cramps or pain not helped by medicine.  You have new pain in the belly (abdomen).  You have a bad smelling fluid coming from your vagina.  You have a rash.  You have problems with any medicine. GET HELP RIGHT AWAY IF:   You start to bleed more than a regular period.  You have a fever.  You have chest pain.  You have trouble breathing.  You feel dizzy or feel like passing out (fainting).  You pass out.  You have pain in the tops of your shoulders.  You have vaginal bleeding with or without clumps of blood (blood clots). MAKE SURE YOU:  Understand these instructions.  Will watch your condition.  Will get help right away if you are not doing well or get worse. Document Released: 03/23/2008 Document Revised: 06/19/2013 Document Reviewed: 01/11/2013 [facility] Patient Information 2015 [place], [place]. This information is not intended to replace advice given to you by your health care provider.  Make sure you discuss any questions you have with your health care provider.  DISCHARGE INSTRUCTIONS: D&C / D&E The following instructions have been prepared to help you care for yourself upon your return home.   Personal hygiene:  Use sanitary pads for vaginal drainage, not tampons.  Shower the day after your procedure.  NO tub baths, pools or Jacuzzis for 2-3 weeks.  Wipe front to back after using the bathroom.  Activity and limitations:  Do NOT drive or operate any equipment for 24 hours. The effects of anesthesia are still present and drowsiness may result.  Do NOT rest in bed all day.  Walking is encouraged.  Walk up and down stairs slowly.  You may resume your normal activity in one to two days or as indicated by your physician.  Sexual activity: NO intercourse for at least 2 weeks after the procedure, or as indicated by your physician.  Diet: Eat a light meal as desired this evening. You may resume your usual diet tomorrow.  Return to work: You may resume your work activities in one to two days or as indicated by your doctor.  What to expect after your surgery: Expect to have vaginal bleeding/discharge for 2-3 days and spotting for up to 10 days. It is not unusual to have soreness for up to 1-2 weeks. You may have a slight burning sensation when you urinate for the first day. Mild cramps may continue for a couple of days. You may have a regular period in 2-6 weeks.  Call your doctor for any of the following:  Excessive vaginal bleeding, saturating and changing one pad every hour.  Inability to urinate 6 hours after discharge from hospital.  Pain not relieved by pain medication.  Fever of 100.4 F or greater.  Unusual vaginal discharge or odor.   Call for an appointment:    Patients signature: ______________________  Nurses signature ________________________  Support person's signature_______________________

## 2014-05-22 NOTE — Anesthesia Preprocedure Evaluation (Addendum)
Anesthesia Evaluation  Patient identified by MRN, date of birth, ID band Patient awake    Reviewed: Allergy & Precautions, H&P , NPO status , Patient's Chart, lab work & pertinent test results, reviewed documented beta blocker date and time   Airway Mallampati: III  TM Distance: >3 FB     Dental no notable dental hx. (+) Chipped,    Pulmonary neg pulmonary ROS,  Snores: probable undiagnosed OSA breath sounds clear to auscultation  Pulmonary exam normal       Cardiovascular hypertension, Pt. on medications and Pt. on home beta blockers Rhythm:Regular Rate:Normal     Neuro/Psych negative neurological ROS  negative psych ROS   GI/Hepatic negative GI ROS, Neg liver ROS,   Endo/Other  Morbid obesity  Renal/GU negative Renal ROS  negative genitourinary   Musculoskeletal negative musculoskeletal ROS (+)   Abdominal (+) + obese,   Peds  Hematology  (+) anemia ,   Anesthesia Other Findings   Reproductive/Obstetrics Incomplete spontaneous Ab Retained POC                            Anesthesia Physical Anesthesia Plan  ASA: III  Anesthesia Plan: MAC   Post-op Pain Management:    Induction: Intravenous  Airway Management Planned: Natural Airway and Simple Face Mask  Additional Equipment:   Intra-op Plan:   Post-operative Plan:   Informed Consent: I have reviewed the patients History and Physical, chart, labs and discussed the procedure including the risks, benefits and alternatives for the proposed anesthesia with the patient or authorized representative who has indicated his/her understanding and acceptance.   Dental advisory given  Plan Discussed with: Anesthesiologist, CRNA and Surgeon  Anesthesia Plan Comments:         Anesthesia Quick Evaluation

## 2014-05-23 ENCOUNTER — Encounter (HOSPITAL_COMMUNITY): Payer: Self-pay | Admitting: Obstetrics and Gynecology

## 2014-05-23 NOTE — Addendum Note (Signed)
Addendum  created 05/23/14 0226 by Genevie Ann, CRNA   Modules edited: Anesthesia Events, Anesthesia Medication Administration, Narrator   Narrator:  Narrator: Event Log Edited

## 2014-05-23 NOTE — Addendum Note (Signed)
Addendum  created 05/23/14 0242 by Genevie Ann, CRNA   Modules edited: Anesthesia Events, Anesthesia Flowsheet, Narrator   Narrator:  Narrator: Event Log Edited

## 2015-03-14 ENCOUNTER — Encounter (HOSPITAL_COMMUNITY): Payer: Self-pay | Admitting: *Deleted

## 2015-04-16 LAB — OB RESULTS CONSOLE HEPATITIS B SURFACE ANTIGEN: Hepatitis B Surface Ag: NEGATIVE

## 2015-04-16 LAB — OB RESULTS CONSOLE HIV ANTIBODY (ROUTINE TESTING): HIV: NONREACTIVE

## 2015-04-16 LAB — OB RESULTS CONSOLE RUBELLA ANTIBODY, IGM: RUBELLA: NON-IMMUNE/NOT IMMUNE

## 2015-04-16 LAB — OB RESULTS CONSOLE RPR: RPR: NONREACTIVE

## 2015-04-30 ENCOUNTER — Other Ambulatory Visit: Payer: Self-pay | Admitting: Obstetrics and Gynecology

## 2015-04-30 ENCOUNTER — Other Ambulatory Visit (HOSPITAL_COMMUNITY)
Admission: RE | Admit: 2015-04-30 | Discharge: 2015-04-30 | Disposition: A | Discharge status: Home / Self Care | Payer: BC Managed Care – PPO | Source: Ambulatory Visit | Attending: Obstetrics and Gynecology | Admitting: Obstetrics and Gynecology

## 2015-04-30 DIAGNOSIS — Z01419 Encounter for gynecological examination (general) (routine) without abnormal findings: Secondary | ICD-10-CM | POA: Diagnosis present

## 2015-04-30 DIAGNOSIS — Z113 Encounter for screening for infections with a predominantly sexual mode of transmission: Secondary | ICD-10-CM | POA: Insufficient documentation

## 2015-04-30 DIAGNOSIS — Z1151 Encounter for screening for human papillomavirus (HPV): Secondary | ICD-10-CM | POA: Diagnosis present

## 2015-05-01 LAB — CYTOLOGY - PAP

## 2015-06-15 ENCOUNTER — Inpatient Hospital Stay (HOSPITAL_COMMUNITY)
Admission: AD | Admit: 2015-06-15 | Discharge: 2015-06-15 | Disposition: A | Discharge status: Home / Self Care | Payer: BC Managed Care – PPO | Source: Ambulatory Visit | Attending: Obstetrics and Gynecology | Admitting: Obstetrics and Gynecology

## 2015-06-15 ENCOUNTER — Encounter (HOSPITAL_COMMUNITY): Payer: Self-pay | Admitting: *Deleted

## 2015-06-15 DIAGNOSIS — R1031 Right lower quadrant pain: Secondary | ICD-10-CM | POA: Insufficient documentation

## 2015-06-15 DIAGNOSIS — Z7982 Long term (current) use of aspirin: Secondary | ICD-10-CM | POA: Diagnosis not present

## 2015-06-15 DIAGNOSIS — O9989 Other specified diseases and conditions complicating pregnancy, childbirth and the puerperium: Secondary | ICD-10-CM

## 2015-06-15 DIAGNOSIS — R109 Unspecified abdominal pain: Secondary | ICD-10-CM

## 2015-06-15 DIAGNOSIS — O09522 Supervision of elderly multigravida, second trimester: Secondary | ICD-10-CM | POA: Insufficient documentation

## 2015-06-15 DIAGNOSIS — Z3A16 16 weeks gestation of pregnancy: Secondary | ICD-10-CM | POA: Insufficient documentation

## 2015-06-15 DIAGNOSIS — O26892 Other specified pregnancy related conditions, second trimester: Secondary | ICD-10-CM | POA: Insufficient documentation

## 2015-06-15 DIAGNOSIS — O26899 Other specified pregnancy related conditions, unspecified trimester: Secondary | ICD-10-CM

## 2015-06-15 LAB — CBC WITH DIFFERENTIAL/PLATELET
BASOS ABS: 0 10*3/uL (ref 0.0–0.1)
BASOS PCT: 0 %
EOS ABS: 0.1 10*3/uL (ref 0.0–0.7)
Eosinophils Relative: 1 %
HEMATOCRIT: 37.2 % (ref 36.0–46.0)
HEMOGLOBIN: 12.8 g/dL (ref 12.0–15.0)
Lymphocytes Relative: 24 %
Lymphs Abs: 1.4 10*3/uL (ref 0.7–4.0)
MCH: 28.6 pg (ref 26.0–34.0)
MCHC: 34.4 g/dL (ref 30.0–36.0)
MCV: 83 fL (ref 78.0–100.0)
MONOS PCT: 11 %
Monocytes Absolute: 0.6 10*3/uL (ref 0.1–1.0)
NEUTROS ABS: 3.7 10*3/uL (ref 1.7–7.7)
Neutrophils Relative %: 64 %
Platelets: 234 10*3/uL (ref 150–400)
RBC: 4.48 MIL/uL (ref 3.87–5.11)
RDW: 14.2 % (ref 11.5–15.5)
WBC: 5.7 10*3/uL (ref 4.0–10.5)

## 2015-06-15 LAB — URINALYSIS, ROUTINE W REFLEX MICROSCOPIC
BILIRUBIN URINE: NEGATIVE
Glucose, UA: NEGATIVE mg/dL
Hgb urine dipstick: NEGATIVE
KETONES UR: 15 mg/dL — AB
Leukocytes, UA: NEGATIVE
Nitrite: NEGATIVE
PROTEIN: NEGATIVE mg/dL
Specific Gravity, Urine: 1.025 (ref 1.005–1.030)
pH: 5.5 (ref 5.0–8.0)

## 2015-06-15 LAB — WET PREP, GENITAL
CLUE CELLS WET PREP: NONE SEEN
SPERM: NONE SEEN
TRICH WET PREP: NONE SEEN
YEAST WET PREP: NONE SEEN

## 2015-06-15 MED ORDER — IBUPROFEN 600 MG PO TABS
600.0000 mg | ORAL_TABLET | Freq: Once | ORAL | Status: AC
  Administered 2015-06-15: 600 mg via ORAL
  Filled 2015-06-15: qty 1

## 2015-06-15 MED ORDER — IBUPROFEN 600 MG PO TABS
600.0000 mg | ORAL_TABLET | Freq: Four times a day (QID) | ORAL | Status: DC | PRN
Start: 2015-06-15 — End: 2015-07-21

## 2015-06-15 NOTE — MAU Provider Note (Signed)
Chief Complaint: Abdominal Pain   First Provider Initiated Contact with Patient 06/15/15 1138     SUBJECTIVE HPI: Mariah Marks is a 44 y.o. G2P0010 at [redacted]w[redacted]d who presents to [facility] reporting RLQ pain since 0400 that started after IC. State she was told she had a low-lying placenta per [place] 4 weeks ago in the office and told to abstain. Worried because she had IC against MD's recommendation and nervous with history of SAB. No VB.   Location: PLQ/groin Quality: sharp Severity: 9/10 on pain scale Duration: <8 hours Context: started after IC Timing: constant, but intermittent exacerbations Modifying factors: None. Hasn't tried anything Associated signs and symptoms: Neg fore fever, chills, decreased appetite, VB, urinary complaints, GI complaints, vaginal discharge.   Past Medical History  Diagnosis Date  . Hypertension   . Anemia    OB History  Gravida Para Term Preterm AB SAB TAB Ectopic Multiple Living  2    1 1     0    # Outcome Date GA Lbr Len/2nd Weight Sex Delivery Anes PTL Lv  2 Current           1 SAB              Past Surgical History  Procedure Laterality Date  . Throat surgery    . Dilation and evacuation N/A 05/22/2014    Procedure: DILATATION AND EVACUATION;  Surgeon: Ena Dawley, MD;  Location: [facility];  Service: Gynecology;  Laterality: N/A;   Social History   Social History  . Marital Status: Married    Spouse Name: N/A  . Number of Children: N/A  . Years of Education: N/A   Occupational History  . Not on file.   Social History Main Topics  . Smoking status: Never Smoker   . Smokeless tobacco: Never Used  . Alcohol Use: No  . Drug Use: No  . Sexual Activity: Yes   Other Topics Concern  . Not on file   Social History Narrative   No current facility-administered medications on file prior to encounter.   Current Outpatient Prescriptions on File Prior to Encounter  Medication Sig Dispense Refill  . ferrous sulfate 325 (65 FE)  MG tablet Take 325 mg by mouth daily with breakfast.    . ibuprofen (ADVIL,MOTRIN) 800 MG tablet Take 1 tablet (800 mg total) by mouth every 8 (eight) hours as needed. (Patient not taking: Reported on 06/15/2015) 50 tablet 1  . ondansetron (ZOFRAN ODT) 8 MG disintegrating tablet Take 1 tablet (8 mg total) by mouth every 8 (eight) hours as needed for nausea or vomiting. (Patient not taking: Reported on 05/09/2014) 20 tablet 0  . oxyCODONE-acetaminophen (PERCOCET/ROXICET) 5-325 MG per tablet Take 2 tablets by mouth every 4 (four) hours as needed for severe pain. (Patient not taking: Reported on 06/15/2015) 20 tablet 0  . oxyCODONE-acetaminophen (ROXICET) 5-325 MG per tablet Take 1 tablet by mouth every 4 (four) hours as needed for severe pain. (Patient not taking: Reported on 06/15/2015) 10 tablet 0   No Known Allergies  I have reviewed the past Medical Hx, Surgical Hx, Social Hx, Allergies and Medications.   Review of Systems  Constitutional: Negative for fever, chills and appetite change.  Gastrointestinal: Positive for abdominal pain. Negative for nausea, vomiting, diarrhea, constipation and blood in stool.  Genitourinary: Negative for dysuria, urgency, frequency, hematuria, flank pain, vaginal bleeding, vaginal discharge and pelvic pain.  Musculoskeletal: Negative for back pain.    OBJECTIVE Patient Vitals for the past 24 hrs:  BP Temp Temp src Pulse Resp Height Weight  06/15/15 1406 155/65 mmHg 98.1 F (36.7 C) Oral 93 18 - -  06/15/15 1307 149/61 mmHg - - 79 18 - -  06/15/15 1134 - - - - - 5\' 8"  (1.727 m) 223 lb (101.152 kg)  06/15/15 1113 155/76 mmHg - - 85 - - -  06/15/15 1112 - 98.2 F (36.8 C) - - 18 - -   Constitutional: Well-developed, well-nourished female in mild-mod distress.  Cardiovascular: normal rate Respiratory: normal rate and effort.  GI: Abd soft, mild right groin tenderness, neg rebound tenderness, involuntary guarding or palpable mass. No tenderness at McBurney's  point. Gravid appropriate for gestational age. Pos BS x 4 Neurologic: Alert and oriented x 4.  GU: Neg CVAT.  SPECULUM EXAM: NEFG, physiologic discharge, no blood noted, cervix clean, visually long and closed.   BIMANUAL: Deferred due to pt reports of LL placenta  Fetal heart rate 147 by Doppler.  LAB RESULTS Results for orders placed or performed during the hospital encounter of 06/15/15 (from the past 24 hour(s))  Urinalysis, Routine w reflex microscopic (not at [facility])     Status: Abnormal   Collection Time: 06/15/15 11:00 AM  Result Value Ref Range   Color, Urine YELLOW YELLOW   APPearance CLEAR CLEAR   Specific Gravity, Urine 1.025 1.005 - 1.030   pH 5.5 5.0 - 8.0   Glucose, UA NEGATIVE NEGATIVE mg/dL   Hgb urine dipstick NEGATIVE NEGATIVE   Bilirubin Urine NEGATIVE NEGATIVE   Ketones, ur 15 (A) NEGATIVE mg/dL   Protein, ur NEGATIVE NEGATIVE mg/dL   Nitrite NEGATIVE NEGATIVE   Leukocytes, UA NEGATIVE NEGATIVE  CBC with Differential/Platelet     Status: None   Collection Time: 06/15/15 11:45 AM  Result Value Ref Range   WBC 5.7 4.0 - 10.5 K/uL   RBC 4.48 3.87 - 5.11 MIL/uL   Hemoglobin 12.8 12.0 - 15.0 g/dL   HCT 37.2 36.0 - 46.0 %   MCV 83.0 78.0 - 100.0 fL   MCH 28.6 26.0 - 34.0 pg   MCHC 34.4 30.0 - 36.0 g/dL   RDW 14.2 11.5 - 15.5 %   Platelets 234 150 - 400 K/uL   Neutrophils Relative % 64 %   Neutro Abs 3.7 1.7 - 7.7 K/uL   Lymphocytes Relative 24 %   Lymphs Abs 1.4 0.7 - 4.0 K/uL   Monocytes Relative 11 %   Monocytes Absolute 0.6 0.1 - 1.0 K/uL   Eosinophils Relative 1 %   Eosinophils Absolute 0.1 0.0 - 0.7 K/uL   Basophils Relative 0 %   Basophils Absolute 0.0 0.0 - 0.1 K/uL  Wet prep, genital     Status: Abnormal   Collection Time: 06/15/15 12:00 PM  Result Value Ref Range   Yeast Wet Prep HPF POC NONE SEEN NONE SEEN   Trich, Wet Prep NONE SEEN NONE SEEN   Clue Cells Wet Prep HPF POC NONE SEEN NONE SEEN   WBC, Wet Prep HPF POC FEW (A) NONE SEEN   Sperm  NONE SEEN     IMAGING No results found.  MAU COURSE CBC, UA, Wet prep, GC/Chlamydia cultures. Informal BS US show active fetus, S=D, FHR 150's.   Discussed Hx, exam, labs w/ Dr. Charlesetta Garibaldi. Ibuprofen ordered.   Pain much better. Pt reassured to see baby.   MDM 44 year old female 16 weeks 3 days gestation with new onset sharp right lower quadrant/groin pain since 4 AM it started after intercourse. Low suspicion  for appendicitis due to absence of fever, leukocytosis, GI complaints and location of pain. Suspect pain is round ligament pain.   ASSESSMENT 1. Abdominal pain affecting pregnancy, antepartum     PLAN Discharge home in stable condition per consult with Dr. Charlesetta Garibaldi. Second trimester Precautions Increase fluids and rest. May use ibuprofen sparingly during the second trimester only.     Follow-up Information    Follow up with Catha Brow., MD On 06/16/2015.   Specialty:  Obstetrics and Gynecology   Why:  Routine prenatal visit   Contact information:   [id number] [place] [address] [phone]       Follow up with [facility].   Why:  As needed if symptoms worsen   Contact information:   [address] [id number] [place] [phone]       Medication List    STOP taking these medications        ondansetron 8 MG disintegrating tablet  Commonly known as:  ZOFRAN ODT     oxyCODONE-acetaminophen 5-325 MG tablet  Commonly known as:  ROXICET      TAKE these medications        aspirin 81 MG tablet  Take 81 mg by mouth daily.     ferrous sulfate 325 (65 FE) MG tablet  Take 325 mg by mouth daily with breakfast.     ibuprofen 600 MG tablet  Commonly known as:  ADVIL,MOTRIN  Take 1 tablet (600 mg total) by mouth every 6 (six) hours as needed for moderate pain.     STOOL SOFTENER PO  Take 1 tablet by mouth daily.         [place], CNM 06/15/2015   2:16 PM

## 2015-06-15 NOTE — Discharge Instructions (Signed)
Abdominal Pain During Pregnancy Abdominal pain is common in pregnancy. Most of the time, it does not cause harm. There are many causes of abdominal pain. Some causes are more serious than others. Some of the causes of abdominal pain in pregnancy are easily diagnosed. Occasionally, the diagnosis takes time to understand. Other times, the cause is not determined. Abdominal pain can be a sign that something is very wrong with the pregnancy, or the pain may have nothing to do with the pregnancy at all. For this reason, always tell your health care provider if you have any abdominal discomfort. HOME CARE INSTRUCTIONS  Monitor your abdominal pain for any changes. The following actions may help to alleviate any discomfort you are experiencing:  Do not have sexual intercourse or put anything in your vagina until your symptoms go away completely.  Get plenty of rest until your pain improves.  Drink clear fluids if you feel nauseous. Avoid solid food as long as you are uncomfortable or nauseous.  Only take over-the-counter or prescription medicine as directed by your health care provider.  Keep all follow-up appointments with your health care provider. SEEK IMMEDIATE MEDICAL CARE IF:  You are bleeding, leaking fluid, or passing tissue from the vagina.  You have increasing pain or cramping.  You have persistent vomiting.  You have painful or bloody urination.  You have a fever.  You notice a decrease in your baby's movements.  You have extreme weakness or feel faint.  You have shortness of breath, with or without abdominal pain.  You develop a severe headache with abdominal pain.  You have abnormal vaginal discharge with abdominal pain.  You have persistent diarrhea.  You have abdominal pain that continues even after rest, or gets worse. MAKE SURE YOU:   Understand these instructions.  Will watch your condition.  Will get help right away if you are not doing well or get worse.     This information is not intended to replace advice given to you by your health care provider. Make sure you discuss any questions you have with your health care provider.   Document Released: 06/14/2005 Document Revised: 04/04/2013 Document Reviewed: 01/11/2013 Elsevier Interactive Patient Education 2016 [employer] Inc[employer]   Appendicitis Appendicitis means the appendix is puffy (inflamed). You need to get medical help right away. Without help, your problems can get much worse. The appendix can develop a hole (perforation). A pocket of yellowish-white fluid (abscess) can leak from the appendix. This can make you very sick. SYMPTOMS   Pain around the belly button (navel). The pain later moves toward the lower right belly (abdomen). The pain may be strong and sharp.  Tenderness in the lower right belly. The pain feels worse if you cough or move suddenly.  Feeling sick to your stomach (nausea).  Throwing up (vomiting).  No desire to eat (loss of appetite).  Fever.  Having a hard time pooping (constipation).  Watery poop (diarrhea).  Generally not feeling well. TREATMENT  In most cases, surgery is done to take out the appendix. This is done as soon as possible. This surgery is called appendectomy. Most people go home in 24 to 48 hours after surgery. If the appendix has a hole, surgery might be delayed. Any yellowish-white fluid will be removed with a drain. A drain removes fluid from the body. You may be given antibiotic medicine that kills germs. This medicine is given through a tube in your vein (IV). You may still need surgery after the fluid has been  drained. You may need to stay in the hospital longer than 48 hours.   This information is not intended to replace advice given to you by your health care provider. Make sure you discuss any questions you have with your health care provider.   Document Released: 09/06/2011 Document Reviewed: 10/30/2014 Elsevier Interactive Patient Education  [employer].

## 2015-06-15 NOTE — MAU Note (Signed)
Pt c/o pain on lower right abdomen that she rates a 6. Pt states the pain started about 4am and then stopped and came back around 8am. Pt has low lying placenta and was told not to have intercourse. Pt had intercourse at 4am when the pain first started.

## 2015-06-29 NOTE — L&D Delivery Note (Addendum)
Delivery Note CTSP for prolonged deceleration because her primary OB was en route for evaluation.  The fetus was in the 70s-90s for about 12 minutes by the time I arrived.  She was already on her side and per the RN she was 7cm and had already been given terbutaline x 1. When I checked her, she already had an FSE on and noted to be anterior lip and +1, no to minimal caput. The cervix fully dilated after one slight push and the fetus now to +2 of its BPD, with watery bright to slightly dark bleeding noted.  EFW 3400gm, LOA and he pelvis felt adequate, with the bladder drained with foley.   D/w pt and r/b/a and decision made for forceps delivery, with OR already being set up prior to my arrival.  Fetus noted to be DOA now and watery bright red bleeding continued with concern for abruption now. Luikart Simpsons easily applied and over 3 pushes and one contraction the patient delivered fetus to +4 and crowning. Forceps were disarticulated and using the Ritgen maneuver head delivered to perineum. Patient told to stop pushing and loose nuchal x 1 reduced. Next, patient instructed to push and normal delivery of the anterior shoulder attempted using steady, normal downward movement of the head but not easily delivering and questionable "turtle sign" noted. Patient told to stop pushing and shoulder dystocia called; no further manipulation of the head was done after this point. She was already back in [place] for the pushing and forceps, so delivery of the posterior arm was done, which resulted in fracture noted (right arm).  Arm was continued to be swept out of the vagina, as it was behind the fetus' back, and at this time, her primary OB entered the room. She did an episotomy (medio-lateral) and right after this the female infant delivered up to the mid chest.  The body was grasped and the rest of the fetus delivered, and the cord immediately clamped and cut and handed to awaiting peds team. Rectal exam negative and  bilateral sulcal tears noted. Primary OB desired to do repair so patient handed off to her.   Infant with right laceration just above the lateral aspect of the eye (approx 1cm at the 11 o'clock position). Infant crying and face appeared to be symmetric and appeared to be moving upper extremities fairly well. Peds told of right arm fracture during delivery.  APGARs 2/9 Total shoulder dystocia time approximately 2 minutes Primary team requested to do cord gases, with segment clamped and cut right after I handed infant to Peds team. EBL during my interaction approximately 420m.  See primary OB note for details for repair  CDurene RomansMD Attending [facility]    Addendum:  Above reviewed and agree. Upon my arrival, head was out and McRoberts maneuver and suprapubic pressures was in progress.  I asked if he was able to delivery the posterior shoulder at which time Dr. PIlda Bassetreported difficulty in doing so. I cut a midline episiotomy to allow him to get his hand in the vaginal vault to assist with delivery of the posterior shoulder.  He then reported he was then able to perform delivery of the posterior shoulder.  He then delivered the torso but was met with resistance so I assisted him with delivery and fetus was brought to the mother's abdomen.  Cord was clamped.  Baby passed off to the awaiting NICU team.  I thanked Dr. PIlda Bassetfor his assistance in  my absence and assumed care of the patient.  Clamped cord segment was cut and arterial and venous cord pH was collected.  Turned attention to delivery of the placenta.  With gentle traction on cord, resistance met.  Placenta was behind cervical os.  Pitocin infusion was in progress.  RN instructed to discontinue this briefly, afterwhich, I was able to deliver the placenta into the vagina.  Pitocin was resumed.  Placenta delivered intact.    Upon evaluation of perineum and vagina, moderate bleeding noted.   Deep bilateral vaginal sulcus tears noted.  Attempts were made to improve visualization and RN asked to assist with retraction.  I repaired the first layer of the right vaginal tear with 2.0 Chromic for hemostasis which slowed bleeding significantly.  I attempted to repair the left vaginal laceration and I was unable to visualize the apex.  At that time I realized how extensive the laceration was I felt it was necessary to optimize visualization for the repair.  Two laparotomy sponges were rolled and placed in the vagina to decrease bleeding while in transit.  Pt was informed her condition and consented for EUA and repair of lacerations in the OR.  Consents signed. Pt transferred emergently to OR #8.  E. Cyndia Skeeters, MD, [facility]

## 2015-07-21 ENCOUNTER — Inpatient Hospital Stay (HOSPITAL_COMMUNITY)
Admission: AD | Admit: 2015-07-21 | Discharge: 2015-07-21 | Disposition: A | Discharge status: Home / Self Care | Payer: BC Managed Care – PPO | Source: Ambulatory Visit | Attending: Obstetrics and Gynecology | Admitting: Obstetrics and Gynecology

## 2015-07-21 ENCOUNTER — Encounter (HOSPITAL_COMMUNITY): Payer: Self-pay | Admitting: *Deleted

## 2015-07-21 DIAGNOSIS — Z7982 Long term (current) use of aspirin: Secondary | ICD-10-CM | POA: Diagnosis not present

## 2015-07-21 DIAGNOSIS — O3412 Maternal care for benign tumor of corpus uteri, second trimester: Secondary | ICD-10-CM | POA: Diagnosis not present

## 2015-07-21 DIAGNOSIS — R102 Pelvic and perineal pain: Secondary | ICD-10-CM | POA: Diagnosis present

## 2015-07-21 DIAGNOSIS — Z3A21 21 weeks gestation of pregnancy: Secondary | ICD-10-CM | POA: Insufficient documentation

## 2015-07-21 DIAGNOSIS — D259 Leiomyoma of uterus, unspecified: Secondary | ICD-10-CM | POA: Insufficient documentation

## 2015-07-21 LAB — URINALYSIS, ROUTINE W REFLEX MICROSCOPIC
Bilirubin Urine: NEGATIVE
Glucose, UA: NEGATIVE mg/dL
Hgb urine dipstick: NEGATIVE
Ketones, ur: 15 mg/dL — AB
LEUKOCYTES UA: NEGATIVE
NITRITE: NEGATIVE
Protein, ur: NEGATIVE mg/dL
Specific Gravity, Urine: >1.03 — ABNORMAL HIGH (ref 1.005–1.030)
pH: 5.5 (ref 5.0–8.0)

## 2015-07-21 MED ORDER — MORPHINE SULFATE (PF) 4 MG/ML IV SOLN
4.0000 mg | Freq: Once | INTRAVENOUS | Status: AC
  Administered 2015-07-21: 4 mg via INTRAMUSCULAR
  Filled 2015-07-21: qty 1

## 2015-07-21 MED ORDER — OXYCODONE-ACETAMINOPHEN 5-325 MG PO TABS: 1.0000 | ORAL_TABLET | Freq: Four times a day (QID) | ORAL | Status: DC | PRN

## 2015-07-21 MED ORDER — PROMETHAZINE HCL 25 MG/ML IJ SOLN
12.5000 mg | Freq: Once | INTRAMUSCULAR | Status: AC
  Administered 2015-07-21: 12.5 mg via INTRAMUSCULAR
  Filled 2015-07-21: qty 1

## 2015-07-21 NOTE — MAU Note (Signed)
Pt states she is having pain in right lower abd, pressure in lower abd. States she has had the abd pain off/on but it has stopped. Lower back pain today also.

## 2015-07-21 NOTE — MAU Provider Note (Signed)
History   [id number]   No chief complaint on file.   HPI Everline Flow is a 45 y.o. female  G2P0010 at [redacted]w[redacted]d IUP here with report of increased pelvic and rectal pressure that started around 1500 today. Pain radiates down both legs.  Hx of uterine fibroids.  Denies vaginal bleeding or leaking of fluid.  Denies dysuria or hematuria.  Dr. Landry Mellow called prior to patient arrival to notify regarding the expected visit to MAU.     Patient's last menstrual period was 02/20/2015.  OB History  Gravida Para Term Preterm AB SAB TAB Ectopic Multiple Living  2    1 1     0    # Outcome Date GA Lbr Len/2nd Weight Sex Delivery Anes PTL Lv  2 Current           1 SAB               Past Medical History  Diagnosis Date  . Hypertension   . Anemia     Family History  Problem Relation Age of Onset  . Diabetes Brother   . Asthma Brother     Social History   Social History  . Marital Status: Married    Spouse Name: N/A  . Number of Children: N/A  . Years of Education: N/A   Social History Main Topics  . Smoking status: Never Smoker   . Smokeless tobacco: Never Used  . Alcohol Use: No  . Drug Use: No  . Sexual Activity: Yes   Other Topics Concern  . None   Social History Narrative    No Known Allergies  No current facility-administered medications on file prior to encounter.   Current Outpatient Prescriptions on File Prior to Encounter  Medication Sig Dispense Refill  . ibuprofen (ADVIL,MOTRIN) 600 MG tablet Take 1 tablet (600 mg total) by mouth every 6 (six) hours as needed for moderate pain. 20 tablet 0  . aspirin 81 MG tablet Take 81 mg by mouth daily.    Mariane Baumgarten Calcium (STOOL SOFTENER PO) Take 1 tablet by mouth daily.    . ferrous sulfate 325 (65 FE) MG tablet Take 325 mg by mouth daily with breakfast.       Review of Systems  Constitutional: Negative for fever and chills.  Gastrointestinal: Positive for rectal pain (rectal pressure).  Genitourinary: Positive for  vaginal pain. Negative for dysuria, hematuria, vaginal bleeding, vaginal discharge and pelvic pain.  Musculoskeletal: Positive for back pain.     Physical Exam   Filed Vitals:   07/21/15 0103  BP: 159/77  Pulse: 88  Temp: 98.2 F (36.8 C)  TempSrc: Oral  Resp: 20  Height: 5\' 3"  (1.6 m)  Weight: 104.781 kg (231 lb)  SpO2: 100%    Physical Exam  Constitutional: She is oriented to person, place, and time. She appears well-developed and well-nourished.  HENT:  Head: Normocephalic.  Neck: Normal range of motion. Neck supple.  Cardiovascular: Normal rate, regular rhythm and normal heart sounds.   Respiratory: Effort normal and breath sounds normal. No respiratory distress.  GI: Soft. There is no tenderness.  Genitourinary: No tenderness or bleeding in the vagina. Vaginal discharge (mucusy) found.  Musculoskeletal: Normal range of motion. She exhibits no edema.  Neurological: She is alert and oriented to person, place, and time.  Skin: Skin is warm and dry.   Dilation: Closed (ext os open int os closed) Exam by:: Reina Fuse CNM  MAU Course  Procedures  MDM 0130 Consulted with  Dr. Landry Mellow > Reviewed HPI/Exam/vitals > give 4 mg IM morphine, check UA; may discharge home if pain resolves  0240 Patient reports pain has improved, desires discharge home  Assessment and Plan  45 y.o. G2P0010 at [redacted]w[redacted]d IUP  Uterine Fibroids Complicating Pregnancy  Plan: Discharge home RX Percocet 5/325 (#10) Follow-up in office Reviewed pregnancy precautions  Gwen Pounds, CNM 07/21/2015

## 2015-07-21 NOTE — Discharge Instructions (Signed)
Uterine Fibroids Uterine fibroids are tissue masses (tumors) that can develop in the womb (uterus). They are also called leiomyomas. This type of tumor is not cancerous (benign) and does not spread to other parts of the body outside of the pelvic area, which is between the hip bones. Occasionally, fibroids may develop in the fallopian tubes, in the cervix, or on the support structures (ligaments) that surround the uterus. You can have one or many fibroids. Fibroids can vary in size, weight, and where they grow in the uterus. Some can become quite large. Most fibroids do not require medical treatment. CAUSES A fibroid can develop when a single uterine cell keeps growing (replicating). Most cells in the human body have a control mechanism that keeps them from replicating without control. SIGNS AND SYMPTOMS Symptoms may include:   Heavy bleeding during your period.  Bleeding or spotting between periods.  Pelvic pain and pressure.  Bladder problems, such as needing to urinate more often (urinary frequency) or urgently.  Inability to reproduce offspring (infertility).  Miscarriages. DIAGNOSIS Uterine fibroids are diagnosed through a physical exam. Your health care provider may feel the lumpy tumors during a pelvic exam. Ultrasonography and an MRI may be done to determine the size, location, and number of fibroids. TREATMENT Treatment may include:  Watchful waiting. This involves getting the fibroid checked by your health care provider to see if it grows or shrinks. Follow your health care provider's recommendations for how often to have this checked.  Hormone medicines. These can be taken by mouth or given through an intrauterine device (IUD).  Surgery.  Removing the fibroids (myomectomy) or the uterus (hysterectomy).  Removing blood supply to the fibroids (uterine artery embolization). If fibroids interfere with your fertility and you want to become pregnant, your health care provider  may recommend having the fibroids removed.  HOME CARE INSTRUCTIONS  Keep all follow-up visits as directed by your health care provider. This is important.  Take medicines only as directed by your health care provider.  If you were prescribed a hormone treatment, take the hormone medicines exactly as directed.  Do not take aspirin, because it can cause bleeding.  Ask your health care provider about taking iron pills and increasing the amount of dark green, leafy vegetables in your diet. These actions can help to boost your blood iron levels, which may be affected by heavy menstrual bleeding.  Pay close attention to your period and tell your health care provider about any changes, such as:  Increased blood flow that requires you to use more pads or tampons than usual per month.  A change in the number of days that your period lasts per month.  A change in symptoms that are associated with your period, such as abdominal cramping or back pain. SEEK MEDICAL CARE IF:  You have pelvic pain, back pain, or abdominal cramps that cannot be controlled with medicines.  You have an increase in bleeding between and during periods.  You soak tampons or pads in a half hour or less.  You feel lightheaded, extra tired, or weak. SEEK IMMEDIATE MEDICAL CARE IF:  You faint.  You have a sudden increase in pelvic pain.   This information is not intended to replace advice given to you by your health care provider. Make sure you discuss any questions you have with your health care provider.   Document Released: 06/11/2000 Document Revised: 07/05/2014 Document Reviewed: 12/11/2013 Elsevier Interactive Patient Education 2016 [employer]  

## 2015-08-17 ENCOUNTER — Inpatient Hospital Stay (HOSPITAL_COMMUNITY)
Admission: AD | Admit: 2015-08-17 | Discharge: 2015-08-17 | Disposition: A | Discharge status: Home / Self Care | Payer: BC Managed Care – PPO | Source: Ambulatory Visit | Attending: Obstetrics and Gynecology | Admitting: Obstetrics and Gynecology

## 2015-08-17 ENCOUNTER — Telehealth: Payer: Self-pay

## 2015-08-17 ENCOUNTER — Encounter (HOSPITAL_COMMUNITY): Payer: Self-pay

## 2015-08-17 DIAGNOSIS — O3412 Maternal care for benign tumor of corpus uteri, second trimester: Secondary | ICD-10-CM | POA: Insufficient documentation

## 2015-08-17 DIAGNOSIS — O26892 Other specified pregnancy related conditions, second trimester: Secondary | ICD-10-CM | POA: Insufficient documentation

## 2015-08-17 DIAGNOSIS — R1031 Right lower quadrant pain: Secondary | ICD-10-CM | POA: Insufficient documentation

## 2015-08-17 DIAGNOSIS — R109 Unspecified abdominal pain: Secondary | ICD-10-CM | POA: Diagnosis not present

## 2015-08-17 DIAGNOSIS — Z825 Family history of asthma and other chronic lower respiratory diseases: Secondary | ICD-10-CM | POA: Diagnosis not present

## 2015-08-17 DIAGNOSIS — D259 Leiomyoma of uterus, unspecified: Secondary | ICD-10-CM

## 2015-08-17 DIAGNOSIS — O9989 Other specified diseases and conditions complicating pregnancy, childbirth and the puerperium: Secondary | ICD-10-CM | POA: Diagnosis not present

## 2015-08-17 DIAGNOSIS — Z3A25 25 weeks gestation of pregnancy: Secondary | ICD-10-CM | POA: Diagnosis not present

## 2015-08-17 DIAGNOSIS — Z833 Family history of diabetes mellitus: Secondary | ICD-10-CM | POA: Insufficient documentation

## 2015-08-17 DIAGNOSIS — O26899 Other specified pregnancy related conditions, unspecified trimester: Secondary | ICD-10-CM

## 2015-08-17 HISTORY — DX: Benign neoplasm of connective and other soft tissue, unspecified: D21.9

## 2015-08-17 LAB — CBC
HCT: 31.3 % — ABNORMAL LOW (ref 36.0–46.0)
Hemoglobin: 10.9 g/dL — ABNORMAL LOW (ref 12.0–15.0)
MCH: 29.2 pg (ref 26.0–34.0)
MCHC: 34.8 g/dL (ref 30.0–36.0)
MCV: 83.9 fL (ref 78.0–100.0)
Platelets: 219 10*3/uL (ref 150–400)
RBC: 3.73 MIL/uL — ABNORMAL LOW (ref 3.87–5.11)
RDW: 13.9 % (ref 11.5–15.5)
WBC: 6.6 10*3/uL (ref 4.0–10.5)

## 2015-08-17 LAB — URINALYSIS, ROUTINE W REFLEX MICROSCOPIC
Bilirubin Urine: NEGATIVE
Glucose, UA: NEGATIVE mg/dL
Hgb urine dipstick: NEGATIVE
Ketones, ur: NEGATIVE mg/dL
Leukocytes, UA: NEGATIVE
Nitrite: NEGATIVE
PH: 6 (ref 5.0–8.0)
Protein, ur: NEGATIVE mg/dL
SPECIFIC GRAVITY, URINE: 1.025 (ref 1.005–1.030)

## 2015-08-17 MED ORDER — OXYCODONE-ACETAMINOPHEN 5-325 MG PO TABS: 2.0000 | ORAL_TABLET | ORAL | Status: DC | PRN

## 2015-08-17 MED ORDER — OXYCODONE-ACETAMINOPHEN 5-325 MG PO TABS
2.0000 | ORAL_TABLET | Freq: Once | ORAL | Status: AC
  Administered 2015-08-17: 2 via ORAL
  Filled 2015-08-17: qty 2

## 2015-08-17 NOTE — MAU Note (Signed)
Patient presents with fibroid pain (history of 14 cm outside uterus on right side, denies vaginal bleeding, denies LOF, positive FM

## 2015-08-17 NOTE — Discharge Instructions (Signed)
Uterine Fibroids Uterine fibroids are tissue masses (tumors) that can develop in the womb (uterus). They are also called leiomyomas. This type of tumor is not cancerous (benign) and does not spread to other parts of the body outside of the pelvic area, which is between the hip bones. Occasionally, fibroids may develop in the fallopian tubes, in the cervix, or on the support structures (ligaments) that surround the uterus. You can have one or many fibroids. Fibroids can vary in size, weight, and where they grow in the uterus. Some can become quite large. Most fibroids do not require medical treatment. CAUSES A fibroid can develop when a single uterine cell keeps growing (replicating). Most cells in the human body have a control mechanism that keeps them from replicating without control. SIGNS AND SYMPTOMS Symptoms may include:   Heavy bleeding during your period.  Bleeding or spotting between periods.  Pelvic pain and pressure.  Bladder problems, such as needing to urinate more often (urinary frequency) or urgently.  Inability to reproduce offspring (infertility).  Miscarriages. DIAGNOSIS Uterine fibroids are diagnosed through a physical exam. Your health care provider may feel the lumpy tumors during a pelvic exam. Ultrasonography and an MRI may be done to determine the size, location, and number of fibroids. TREATMENT Treatment may include:  Watchful waiting. This involves getting the fibroid checked by your health care provider to see if it grows or shrinks. Follow your health care provider's recommendations for how often to have this checked.  Hormone medicines. These can be taken by mouth or given through an intrauterine device (IUD).  Surgery.  Removing the fibroids (myomectomy) or the uterus (hysterectomy).  Removing blood supply to the fibroids (uterine artery embolization). If fibroids interfere with your fertility and you want to become pregnant, your health care provider  may recommend having the fibroids removed.  HOME CARE INSTRUCTIONS  Keep all follow-up visits as directed by your health care provider. This is important.  Take medicines only as directed by your health care provider.  If you were prescribed a hormone treatment, take the hormone medicines exactly as directed.  Do not take aspirin, because it can cause bleeding.  Ask your health care provider about taking iron pills and increasing the amount of dark green, leafy vegetables in your diet. These actions can help to boost your blood iron levels, which may be affected by heavy menstrual bleeding.  Pay close attention to your period and tell your health care provider about any changes, such as:  Increased blood flow that requires you to use more pads or tampons than usual per month.  A change in the number of days that your period lasts per month.  A change in symptoms that are associated with your period, such as abdominal cramping or back pain. SEEK MEDICAL CARE IF:  You have pelvic pain, back pain, or abdominal cramps that cannot be controlled with medicines.  You have an increase in bleeding between and during periods.  You soak tampons or pads in a half hour or less.  You feel lightheaded, extra tired, or weak. SEEK IMMEDIATE MEDICAL CARE IF:  You faint.  You have a sudden increase in pelvic pain.   This information is not intended to replace advice given to you by your health care provider. Make sure you discuss any questions you have with your health care provider.   Document Released: 06/11/2000 Document Revised: 07/05/2014 Document Reviewed: 12/11/2013 Elsevier Interactive Patient Education 2016 [employer]  

## 2015-08-17 NOTE — MAU Provider Note (Signed)
History     CSN: [id number]  Arrival date and time: 08/17/15 1416   First Provider Initiated Contact with Patient 08/17/15 1459      Chief Complaint  Patient presents with  . Abdominal Pain   HPI   [name] is a 45 y.o. female G2P0010 at [redacted]w[redacted]d with a history of a 14 cm uterine fibroid noted on [place] 1 month ago.  Dr. Landry Mellow has been managing her pain through her office.  She has been using ibuprofen and tylenol for pain; she feels her pain is not well controlled at this time using this.   She currently rates her pain 5-6 The pain is located in the right lower quadrant and shoots down her leg. This pain is consistent with the pain she has been having for more than a month.  Denies fever Denies vaginal bleeding  No recent intercourse.     OB History    Gravida Para Term Preterm AB TAB SAB Ectopic Multiple Living   2    1  1    0      Past Medical History  Diagnosis Date  . Hypertension   . Anemia   . Fibroid     Past Surgical History  Procedure Laterality Date  . Throat surgery    . Dilation and evacuation N/A 05/22/2014    Procedure: DILATATION AND EVACUATION;  Surgeon: Ena Dawley, MD;  Location: [facility];  Service: Gynecology;  Laterality: N/A;    Family History  Problem Relation Age of Onset  . Diabetes Brother   . Asthma Brother     Social History  Substance Use Topics  . Smoking status: Never Smoker   . Smokeless tobacco: Never Used  . Alcohol Use: No    Allergies: No Known Allergies  Prescriptions prior to admission  Medication Sig Dispense Refill Last Dose  . aspirin 81 MG tablet Take 81 mg by mouth daily.   Past Month at Unknown time  . prenatal vitamin w/FE, FA (PRENATAL 1 + 1) 27-1 MG TABS tablet Take 1 tablet by mouth daily at 12 noon.   08/16/2015 at Unknown time  . triamcinolone (KENALOG) 0.025 % cream Apply 1 application topically 2 (two) times daily.   prn  . Docusate Calcium (STOOL SOFTENER PO) Take 1 tablet by mouth daily.   prn   . oxyCODONE-acetaminophen (PERCOCET/ROXICET) 5-325 MG tablet Take 1-2 tablets by mouth every 6 (six) hours as needed. (Patient not taking: Reported on 08/17/2015) 10 tablet 0 Not Taking at Unknown time   Results for orders placed or performed during the hospital encounter of 08/17/15 (from the past 48 hour(s))  Urinalysis, Routine w reflex microscopic (not at [facility])     Status: None   Collection Time: 08/17/15  2:25 PM  Result Value Ref Range   Color, Urine YELLOW YELLOW   APPearance CLEAR CLEAR   Specific Gravity, Urine 1.025 1.005 - 1.030   pH 6.0 5.0 - 8.0   Glucose, UA NEGATIVE NEGATIVE mg/dL   Hgb urine dipstick NEGATIVE NEGATIVE   Bilirubin Urine NEGATIVE NEGATIVE   Ketones, ur NEGATIVE NEGATIVE mg/dL   Protein, ur NEGATIVE NEGATIVE mg/dL   Nitrite NEGATIVE NEGATIVE   Leukocytes, UA NEGATIVE NEGATIVE    Comment: MICROSCOPIC NOT DONE ON URINES WITH NEGATIVE PROTEIN, BLOOD, LEUKOCYTES, NITRITE, OR GLUCOSE <1000 mg/dL.  CBC     Status: Abnormal   Collection Time: 08/17/15  3:09 PM  Result Value Ref Range   WBC 6.6 4.0 - 10.5 K/uL  RBC 3.73 (L) 3.87 - 5.11 MIL/uL   Hemoglobin 10.9 (L) 12.0 - 15.0 g/dL   HCT 31.3 (L) 36.0 - 46.0 %   MCV 83.9 78.0 - 100.0 fL   MCH 29.2 26.0 - 34.0 pg   MCHC 34.8 30.0 - 36.0 g/dL   RDW 13.9 11.5 - 15.5 %   Platelets 219 150 - 400 K/uL    Review of Systems  Constitutional: Negative for fever and chills.  Gastrointestinal: Positive for vomiting (occasional ) and abdominal pain. Negative for nausea.   Physical Exam   Blood pressure 118/71, pulse 103, temperature 98.1 F (36.7 C), temperature source Oral, resp. rate 18, last menstrual period 02/20/2015, unknown if currently breastfeeding.  Physical Exam  Constitutional: She is oriented to person, place, and time. She appears well-developed and well-nourished. No distress.  HENT:  Head: Normocephalic.  Eyes: Pupils are equal, round, and reactive to light.  Neck: Neck supple.  Respiratory:  Effort normal and breath sounds normal.  GI: Soft. Normal appearance. There is tenderness in the right upper quadrant, right lower quadrant, periumbilical area and suprapubic area. There is no rigidity, no rebound and no guarding.  Genitourinary:   Cervix closed   Musculoskeletal: Normal range of motion.  Neurological: She is alert and oriented to person, place, and time.  Skin: Skin is warm. She is not diaphoretic.  Psychiatric: Her behavior is normal.    MAU Course  Procedures  None  MDM  CBC Percocet 2 tabs Discussed patient with Dr. Alesia Richards @ [phone].  Pain much improved following pain medication.   Assessment and Plan   A:  1. Uterine leiomyoma, unspecified location   2. Abdominal pain in pregnancy     P:  Discharge home in stable condition RX: Percocet Follow up with Dr. Landry Mellow Return to MAU if symptoms worsen  Continue ibuprofen    Lezlie Lye, NP 08/17/2015 10:06 AM

## 2015-08-17 NOTE — Telephone Encounter (Signed)
Patient call reports "fibroid" pain throughout the day.  Patient states she has taken ibuprofen and tylenol without relief.  Patient requests rx be sent in for percocet which she has a good response to in the past.  Patient informed that she would need to be evaluated prior to rx being given and instructed to report to MAU.  Patient further encouraged to have someone drive her so that she could receiving dosing, for pain, prior to discharge.  Patient verbalized understanding and stated she was en route.  Patient called back ~20 minutes later stating that she did not want to report to MAU for evaluation and instead would go to "[facility]" in the am "if that was okay."  Patient informed that this provider was not familiar with the [facility] clinic and she could try it if she has had success in the past.  Patient further informed that she could be sent to [facility] for treatment due to pregnancy status. Patient verbalized understanding.  Patient questions if she could take additional tylenol and motrin stating that she took 1000mg  and 800mg  respectively.  Patient informed that at this dosing she should be taking these medications every 8 hours and can try rotating them if necessary.  No other questions or concerns.  Patient encouraged to call back if any should arise. JE, CNM

## 2015-08-17 NOTE — MAU Note (Signed)
Called Dr. Alesia Richards with report received order for pain medication and MAU provider to evaluate.

## 2015-11-03 LAB — OB RESULTS CONSOLE GBS: STREP GROUP B AG: POSITIVE

## 2015-11-06 ENCOUNTER — Inpatient Hospital Stay (HOSPITAL_COMMUNITY): Payer: BC Managed Care – PPO

## 2015-11-06 ENCOUNTER — Encounter (HOSPITAL_COMMUNITY): Payer: Self-pay | Admitting: *Deleted

## 2015-11-06 ENCOUNTER — Inpatient Hospital Stay (HOSPITAL_COMMUNITY)
Admission: AD | Admit: 2015-11-06 | Discharge: 2015-11-06 | Disposition: A | Discharge status: Home / Self Care | Payer: BC Managed Care – PPO | Source: Ambulatory Visit | Attending: Obstetrics and Gynecology | Admitting: Obstetrics and Gynecology

## 2015-11-06 DIAGNOSIS — Z3A37 37 weeks gestation of pregnancy: Secondary | ICD-10-CM

## 2015-11-06 DIAGNOSIS — O10913 Unspecified pre-existing hypertension complicating pregnancy, third trimester: Secondary | ICD-10-CM | POA: Diagnosis not present

## 2015-11-06 DIAGNOSIS — Z7982 Long term (current) use of aspirin: Secondary | ICD-10-CM | POA: Diagnosis not present

## 2015-11-06 DIAGNOSIS — O288 Other abnormal findings on antenatal screening of mother: Secondary | ICD-10-CM

## 2015-11-06 LAB — COMPREHENSIVE METABOLIC PANEL
ALT: 13 U/L — AB (ref 14–54)
AST: 15 U/L (ref 15–41)
Albumin: 3.4 g/dL — ABNORMAL LOW (ref 3.5–5.0)
Alkaline Phosphatase: 107 U/L (ref 38–126)
Anion gap: 10 (ref 5–15)
BUN: 9 mg/dL (ref 6–20)
CO2: 21 mmol/L — ABNORMAL LOW (ref 22–32)
CREATININE: 0.54 mg/dL (ref 0.44–1.00)
Calcium: 9.4 mg/dL (ref 8.9–10.3)
Chloride: 106 mmol/L (ref 101–111)
GFR calc Af Amer: >60 mL/min (ref >60)
GFR calc non Af Amer: >60 mL/min (ref >60)
Glucose, Bld: 86 mg/dL (ref 65–99)
Potassium: 4 mmol/L (ref 3.5–5.1)
SODIUM: 137 mmol/L (ref 135–145)
Total Bilirubin: 0.4 mg/dL (ref 0.3–1.2)
Total Protein: 6.8 g/dL (ref 6.5–8.1)

## 2015-11-06 LAB — CBC
HCT: 36.2 % (ref 36.0–46.0)
Hemoglobin: 12.7 g/dL (ref 12.0–15.0)
MCH: 29.5 pg (ref 26.0–34.0)
MCHC: 35.1 g/dL (ref 30.0–36.0)
MCV: 84 fL (ref 78.0–100.0)
Platelets: 218 10*3/uL (ref 150–400)
RBC: 4.31 MIL/uL (ref 3.87–5.11)
RDW: 13.9 % (ref 11.5–15.5)
WBC: 6.2 10*3/uL (ref 4.0–10.5)

## 2015-11-06 LAB — URINE MICROSCOPIC-ADD ON

## 2015-11-06 LAB — URINALYSIS, ROUTINE W REFLEX MICROSCOPIC
Bilirubin Urine: NEGATIVE
GLUCOSE, UA: NEGATIVE mg/dL
HGB URINE DIPSTICK: NEGATIVE
Ketones, ur: NEGATIVE mg/dL
Nitrite: NEGATIVE
Protein, ur: NEGATIVE mg/dL
pH: 5.5 (ref 5.0–8.0)

## 2015-11-06 LAB — URIC ACID: URIC ACID, SERUM: 3.3 mg/dL (ref 2.3–6.6)

## 2015-11-06 LAB — LACTATE DEHYDROGENASE: LDH: 113 U/L (ref 98–192)

## 2015-11-06 NOTE — MAU Provider Note (Signed)
History     CSN: [id number]  Arrival date and time: 11/06/15 1701   First Provider Initiated Contact with Patient 11/06/15 1832        Chief Complaint  Patient presents with  . Hypertension  . fetal monitoring    fetal monitoring   HPI Mariah Marks is a 45 y.o. G2P0010 at [redacted]w[redacted]d who presents sent from the office for a BPP & fetal monitoring. Patient was in office today and had a non reactive NST.  Patient denies abdominal pain, vaginal bleeding, or LOF. Positive fetal movement.  PMH significant for chronic hypertension, currently not on meds. Denies headache, vision changes, epigastric pain, or chest pain,   OB History    Gravida Para Term Preterm AB TAB SAB Ectopic Multiple Living   2    1  1    0      Past Medical History  Diagnosis Date  . Hypertension   . Anemia   . Fibroid     Past Surgical History  Procedure Laterality Date  . Throat surgery    . Dilation and evacuation N/A 05/22/2014    Procedure: DILATATION AND EVACUATION;  Surgeon: Ena Dawley, MD;  Location: [facility];  Service: Gynecology;  Laterality: N/A;    Family History  Problem Relation Age of Onset  . Diabetes Brother   . Asthma Brother     Social History  Substance Use Topics  . Smoking status: Never Smoker   . Smokeless tobacco: Never Used  . Alcohol Use: No    Allergies: No Known Allergies  Prescriptions prior to admission  Medication Sig Dispense Refill Last Dose  . aspirin 81 MG tablet Take 81 mg by mouth daily.   Past Month at Unknown time  . Docusate Calcium (STOOL SOFTENER PO) Take 1 tablet by mouth daily.   prn  . oxyCODONE-acetaminophen (PERCOCET/ROXICET) 5-325 MG tablet Take 2 tablets by mouth every 4 (four) hours as needed for severe pain. 15 tablet 0   . prenatal vitamin w/FE, FA (PRENATAL 1 + 1) 27-1 MG TABS tablet Take 1 tablet by mouth daily at 12 noon.   08/16/2015 at Unknown time  . triamcinolone (KENALOG) 0.025 % cream Apply 1 application topically 2 (two) times  daily.   prn    Review of Systems  Constitutional: Negative.   Eyes: Negative for blurred vision.  Cardiovascular: Negative.   Gastrointestinal: Negative.   Genitourinary: Negative.   Neurological: Negative for headaches.   Physical Exam   Blood pressure 151/74, pulse 97, temperature 98.3 F (36.8 C), temperature source Oral, resp. rate 18, last menstrual period 02/20/2015, SpO2 100 %, unknown if currently breastfeeding.  Physical Exam  Nursing note and vitals reviewed. Constitutional: She is oriented to person, place, and time. She appears well-developed and well-nourished. No distress.  HENT:  Head: Normocephalic and atraumatic.  Eyes: Conjunctivae are normal. Right eye exhibits no discharge. Left eye exhibits no discharge. No scleral icterus.  Neck: Normal range of motion.  Cardiovascular: Normal rate, regular rhythm and normal heart sounds.   No murmur heard. Respiratory: Effort normal and breath sounds normal. No respiratory distress. She has no wheezes.  GI: Soft. There is no tenderness.  Musculoskeletal: She exhibits edema (BLE, nonpitting).  Neurological: She is alert and oriented to person, place, and time. She has normal reflexes.  No clonus  Skin: Skin is warm and dry. She is not diaphoretic.  Psychiatric: She has a normal mood and affect. Her behavior is normal. Judgment and thought content normal.  Fetal Tracing:  Baseline: 130 Variability: moderate Accelerations: 15x15 Decelerations: none  Toco: irregular   MAU Course  Procedures Results for orders placed or performed during the hospital encounter of 11/06/15 (from the past 24 hour(s))  Protein / creatinine ratio, urine     Status: None   Collection Time: 11/06/15  7:00 PM  Result Value Ref Range   Creatinine, Urine 39.00 mg/dL   Total Protein, Urine <6 mg/dL   Protein Creatinine Ratio        0.00 - 0.15 mg/mg[Cre]  Urinalysis, Routine w reflex microscopic (not at [facility])     Status: Abnormal    Collection Time: 11/06/15  7:00 PM  Result Value Ref Range   Color, Urine YELLOW YELLOW   APPearance CLEAR CLEAR   Specific Gravity, Urine <1.005 (L) 1.005 - 1.030   pH 5.5 5.0 - 8.0   Glucose, UA NEGATIVE NEGATIVE mg/dL   Hgb urine dipstick NEGATIVE NEGATIVE   Bilirubin Urine NEGATIVE NEGATIVE   Ketones, ur NEGATIVE NEGATIVE mg/dL   Protein, ur NEGATIVE NEGATIVE mg/dL   Nitrite NEGATIVE NEGATIVE   Leukocytes, UA MODERATE (A) NEGATIVE  Urine microscopic-add on     Status: Abnormal   Collection Time: 11/06/15  7:00 PM  Result Value Ref Range   Squamous Epithelial / LPF 0-5 (A) NONE SEEN   WBC, UA 0-5 0 - 5 WBC/hpf   RBC / HPF 0-5 0 - 5 RBC/hpf   Bacteria, UA FEW (A) NONE SEEN  CBC     Status: None   Collection Time: 11/06/15  7:10 PM  Result Value Ref Range   WBC 6.2 4.0 - 10.5 K/uL   RBC 4.31 3.87 - 5.11 MIL/uL   Hemoglobin 12.7 12.0 - 15.0 g/dL   HCT 36.2 36.0 - 46.0 %   MCV 84.0 78.0 - 100.0 fL   MCH 29.5 26.0 - 34.0 pg   MCHC 35.1 30.0 - 36.0 g/dL   RDW 13.9 11.5 - 15.5 %   Platelets 218 150 - 400 K/uL  Comprehensive metabolic panel     Status: Abnormal   Collection Time: 11/06/15  7:10 PM  Result Value Ref Range   Sodium 137 135 - 145 mmol/L   Potassium 4.0 3.5 - 5.1 mmol/L   Chloride 106 101 - 111 mmol/L   CO2 21 (L) 22 - 32 mmol/L   Glucose, Bld 86 65 - 99 mg/dL   BUN 9 6 - 20 mg/dL   Creatinine, Ser 0.54 0.44 - 1.00 mg/dL   Calcium 9.4 8.9 - 10.3 mg/dL   Total Protein 6.8 6.5 - 8.1 g/dL   Albumin 3.4 (L) 3.5 - 5.0 g/dL   AST 15 15 - 41 U/L   ALT 13 (L) 14 - 54 U/L   Alkaline Phosphatase 107 38 - 126 U/L   Total Bilirubin 0.4 0.3 - 1.2 mg/dL   GFR calc non Af Amer >60 >60 mL/min   GFR calc Af Amer >60 >60 mL/min   Anion gap 10 5 - 15  Lactate dehydrogenase     Status: None   Collection Time: 11/06/15  7:10 PM  Result Value Ref Range   LDH 113 98 - 192 U/L  Uric acid     Status: None   Collection Time: 11/06/15  7:10 PM  Result Value Ref Range   Uric  Acid, Serum 3.3 2.3 - 6.6 mg/dL    MDM Reactive tracing BPP 8/9, AFI 17 S/w Dr. Landry Mellow. Will do pre eclampsia labs, if normal  will discharge home BPs elevated, no severe range BPs.   Assessment and Plan  A: 1. Chronic hypertension in pregnancy, third trimester   2. Non-reactive NST (non-stress test)   3. [redacted] weeks gestation of pregnancy     P; Discharge home Discussed reasons to return to MAU Fetal kick count form Keep f/u with ob  Jorje Guild 11/06/2015, 6:31 PM

## 2015-11-06 NOTE — MAU Note (Signed)
Pt currently in Ultrasound for BPP prior to being brought back to MAU.  Pt was sent to MAU for prolonged EFM due to nonreactive strip in office. Also here for BPP.

## 2015-11-06 NOTE — MAU Note (Signed)
Pt states good fetal movement.  Denies vaginal bleeding or abnormal discharge.

## 2015-11-06 NOTE — Discharge Instructions (Signed)

## 2015-11-07 LAB — PROTEIN / CREATININE RATIO, URINE: CREATININE, URINE: 39 mg/dL

## 2015-11-14 ENCOUNTER — Telehealth (HOSPITAL_COMMUNITY): Payer: Self-pay | Admitting: *Deleted

## 2015-11-14 NOTE — Telephone Encounter (Signed)
Preadmission screen  

## 2015-11-22 ENCOUNTER — Inpatient Hospital Stay (HOSPITAL_COMMUNITY): Admission: RE | Admit: 2015-11-22 | Payer: BC Managed Care – PPO | Source: Ambulatory Visit

## 2015-11-25 ENCOUNTER — Other Ambulatory Visit (HOSPITAL_COMMUNITY): Payer: Self-pay | Admitting: Obstetrics and Gynecology

## 2015-11-25 ENCOUNTER — Encounter (HOSPITAL_COMMUNITY): Payer: Self-pay | Admitting: Certified Nurse Midwife

## 2015-11-25 ENCOUNTER — Inpatient Hospital Stay (HOSPITAL_COMMUNITY)
Admission: AD | Admit: 2015-11-25 | Discharge: 2015-11-30 | DRG: 774 | Disposition: A | Discharge status: Home / Self Care | Payer: BC Managed Care – PPO | Source: Ambulatory Visit | Attending: Obstetrics and Gynecology | Admitting: Obstetrics and Gynecology

## 2015-11-25 DIAGNOSIS — O10919 Unspecified pre-existing hypertension complicating pregnancy, unspecified trimester: Secondary | ICD-10-CM | POA: Diagnosis present

## 2015-11-25 DIAGNOSIS — Z3A39 39 weeks gestation of pregnancy: Secondary | ICD-10-CM | POA: Diagnosis not present

## 2015-11-25 DIAGNOSIS — Z23 Encounter for immunization: Secondary | ICD-10-CM | POA: Diagnosis not present

## 2015-11-25 DIAGNOSIS — O99824 Streptococcus B carrier state complicating childbirth: Secondary | ICD-10-CM | POA: Diagnosis present

## 2015-11-25 DIAGNOSIS — O1092 Unspecified pre-existing hypertension complicating childbirth: Principal | ICD-10-CM | POA: Diagnosis present

## 2015-11-25 LAB — CBC
HEMATOCRIT: 34.1 % — AB (ref 36.0–46.0)
HEMOGLOBIN: 11.7 g/dL — AB (ref 12.0–15.0)
MCH: 28.7 pg (ref 26.0–34.0)
MCHC: 34.3 g/dL (ref 30.0–36.0)
MCV: 83.8 fL (ref 78.0–100.0)
PLATELETS: 223 10*3/uL (ref 150–400)
RBC: 4.07 MIL/uL (ref 3.87–5.11)
RDW: 13.8 % (ref 11.5–15.5)
WBC: 5.4 10*3/uL (ref 4.0–10.5)

## 2015-11-25 LAB — URIC ACID: URIC ACID, SERUM: 3.7 mg/dL (ref 2.3–6.6)

## 2015-11-25 LAB — COMPREHENSIVE METABOLIC PANEL
ALT: 19 U/L (ref 14–54)
AST: 21 U/L (ref 15–41)
Albumin: 3 g/dL — ABNORMAL LOW (ref 3.5–5.0)
Alkaline Phosphatase: 125 U/L (ref 38–126)
Anion gap: 8 (ref 5–15)
BILIRUBIN TOTAL: 0.4 mg/dL (ref 0.3–1.2)
BUN: 7 mg/dL (ref 6–20)
CHLORIDE: 107 mmol/L (ref 101–111)
CO2: 22 mmol/L (ref 22–32)
CREATININE: 0.62 mg/dL (ref 0.44–1.00)
Calcium: 8.8 mg/dL — ABNORMAL LOW (ref 8.9–10.3)
GFR calc Af Amer: >60 mL/min (ref >60)
Glucose, Bld: 122 mg/dL — ABNORMAL HIGH (ref 65–99)
Potassium: 3.4 mmol/L — ABNORMAL LOW (ref 3.5–5.1)
Sodium: 137 mmol/L (ref 135–145)
Total Protein: 6.4 g/dL — ABNORMAL LOW (ref 6.5–8.1)

## 2015-11-25 LAB — LACTATE DEHYDROGENASE: LDH: 114 U/L (ref 98–192)

## 2015-11-25 MED ORDER — SOD CITRATE-CITRIC ACID 500-334 MG/5ML PO SOLN
30.0000 mL | ORAL | Status: DC | PRN
  Administered 2015-11-28: 30 mL via ORAL
  Filled 2015-11-25: qty 15

## 2015-11-25 MED ORDER — HYDRALAZINE HCL 20 MG/ML IJ SOLN: 10.0000 mg | Freq: Once | INTRAMUSCULAR | Status: DC | PRN

## 2015-11-25 MED ORDER — LABETALOL HCL 5 MG/ML IV SOLN
20.0000 mg | INTRAVENOUS | Status: DC | PRN
  Administered 2015-11-27: 20 mg via INTRAVENOUS
  Filled 2015-11-25: qty 4

## 2015-11-25 MED ORDER — OXYCODONE-ACETAMINOPHEN 5-325 MG PO TABS: 1.0000 | ORAL_TABLET | ORAL | Status: DC | PRN

## 2015-11-25 MED ORDER — ZOLPIDEM TARTRATE 5 MG PO TABS
5.0000 mg | ORAL_TABLET | Freq: Every evening | ORAL | Status: DC | PRN
Start: 2015-11-25 — End: 2015-11-30
  Administered 2015-11-26: 5 mg via ORAL
  Filled 2015-11-25 (×2): qty 1

## 2015-11-25 MED ORDER — OXYTOCIN BOLUS FROM INFUSION
500.0000 mL | INTRAVENOUS | Status: DC
  Administered 2015-11-28: 500 mL via INTRAVENOUS

## 2015-11-25 MED ORDER — LACTATED RINGERS IV SOLN
500.0000 mL | INTRAVENOUS | Status: DC | PRN
  Administered 2015-11-28: 500 mL via INTRAVENOUS

## 2015-11-25 MED ORDER — MISOPROSTOL 25 MCG QUARTER TABLET
25.0000 ug | ORAL_TABLET | ORAL | Status: DC | PRN
  Administered 2015-11-25 – 2015-11-26 (×4): 25 ug via VAGINAL
  Filled 2015-11-25 (×4): qty 0.25

## 2015-11-25 MED ORDER — HYDROXYZINE HCL 50 MG PO TABS: 50.0000 mg | ORAL_TABLET | Freq: Four times a day (QID) | ORAL | Status: DC | PRN

## 2015-11-25 MED ORDER — PENICILLIN G POTASSIUM 5000000 UNITS IJ SOLR
2.5000 10*6.[IU] | INTRAMUSCULAR | Status: DC
  Administered 2015-11-26 – 2015-11-28 (×12): 2.5 10*6.[IU] via INTRAVENOUS
  Filled 2015-11-25 (×16): qty 2.5

## 2015-11-25 MED ORDER — ONDANSETRON HCL 4 MG/2ML IJ SOLN: 4.0000 mg | Freq: Four times a day (QID) | INTRAMUSCULAR | Status: DC | PRN

## 2015-11-25 MED ORDER — LIDOCAINE HCL (PF) 1 % IJ SOLN: 30.0000 mL | INTRAMUSCULAR | Status: DC | PRN

## 2015-11-25 MED ORDER — OXYTOCIN 40 UNITS IN LACTATED RINGERS INFUSION - SIMPLE MED
2.5000 [IU]/h | INTRAVENOUS | Status: DC
  Filled 2015-11-25: qty 1000

## 2015-11-25 MED ORDER — DEXTROSE 5 % IV SOLN
5.0000 10*6.[IU] | Freq: Once | INTRAVENOUS | Status: AC
  Administered 2015-11-26: 5 10*6.[IU] via INTRAVENOUS
  Filled 2015-11-25: qty 5

## 2015-11-25 MED ORDER — OXYCODONE-ACETAMINOPHEN 5-325 MG PO TABS: 2.0000 | ORAL_TABLET | ORAL | Status: DC | PRN

## 2015-11-25 MED ORDER — LACTATED RINGERS IV SOLN
INTRAVENOUS | Status: DC
  Administered 2015-11-25 – 2015-11-28 (×5): via INTRAVENOUS

## 2015-11-25 MED ORDER — ACETAMINOPHEN 325 MG PO TABS: 650.0000 mg | ORAL_TABLET | ORAL | Status: DC | PRN

## 2015-11-25 MED ORDER — FENTANYL CITRATE (PF) 100 MCG/2ML IJ SOLN: 50.0000 ug | INTRAMUSCULAR | Status: DC | PRN

## 2015-11-25 MED ORDER — TERBUTALINE SULFATE 1 MG/ML IJ SOLN
0.2500 mg | Freq: Once | INTRAMUSCULAR | Status: DC | PRN
  Filled 2015-11-25: qty 1

## 2015-11-25 NOTE — Progress Notes (Signed)
In to check on pt and review labs.  Pt doing well, no complaints.  BP has been lower in hospital. Preeclampsia labs negative.  PCR still pending.  Pt without questions, understands rationale for admission.  Continue with Cytotec induction as planned by Dr. Landry Mellow.  F/u PCR.

## 2015-11-25 NOTE — H&P (Signed)
Mariah Marks is a 45 y.o. female  G3P0020 at 39 wks and 5 days based on 8 wk ultrasound with EDD 11/27/2015. Prenatal care is provided by Dr. Christophe Louis with [facility]. Pregnancy is complicated by Chronic hypertension. ( not on medication during pregnancy) / AMA/ uterine fibroids ( right pedunculted 14 cm. And anterior fibroid 5 cm). Pt presented to the office today for visit and bp was 164/014 repeat 149/96. Pt denies Headache, visual disturbances or ruq pain.     History OB History    Gravida Para Term Preterm AB TAB SAB Ectopic Multiple Living   2    1  1    0     Past Medical History  Diagnosis Date  . Hypertension   . Anemia   . Fibroid    Past Surgical History  Procedure Laterality Date  . Throat surgery    . Dilation and evacuation N/A 05/22/2014    Procedure: DILATATION AND EVACUATION;  Surgeon: Ena Dawley, MD;  Location: [facility];  Service: Gynecology;  Laterality: N/A;   Family History: family history includes Asthma in her brother; Diabetes in her brother. Social History:  reports that she has never smoked. She has never used smokeless tobacco. She reports that she does not drink alcohol or use illicit drugs.   Prenatal Transfer Tool  Maternal Diabetes: No Genetic Screening: Normal Maternal Ultrasounds/Referrals: Normal Fetal Ultrasounds or other Referrals:  None Maternal Substance Abuse:  No Significant Maternal Medications:  None Significant Maternal Lab Results:  Lab values include: Group B Strep positive Other Comments:  None  Review of Systems  Constitutional: Negative.   HENT: Negative.   Eyes: Negative.   Respiratory: Negative.   Cardiovascular: Negative.   Gastrointestinal: Negative.   Genitourinary: Negative.   Musculoskeletal: Negative.   Skin: Negative.   Neurological: Negative.  Negative for dizziness.  Endo/Heme/Allergies: Negative.   Psychiatric/Behavioral: Negative.       Last menstrual period 02/20/2015, unknown if currently  breastfeeding. Maternal Exam:  Abdomen: Patient reports no abdominal tenderness. Estimated fetal weight is 11/17/2015 ultrasound EFW 7 lbs 4 oz  ( 66%ile) .   Fetal presentation: vertex  Introitus: Normal vulva. Normal vagina.    Fetal Exam Fetal Monitor Review: Mode: fetoscope.   Baseline rate: 130.  Variability: moderate (6-25 bpm).    Fetal State Assessment: Category I - tracings are normal.     Physical Exam  Vitals reviewed. Constitutional: She is oriented to person, place, and time. She appears well-developed and well-nourished.  HENT:  Head: Normocephalic and atraumatic.  Eyes: Conjunctivae are normal. Pupils are equal, round, and reactive to light.  Neck: Normal range of motion. Neck supple.  Cardiovascular: Normal rate and regular rhythm.   Respiratory: Effort normal and breath sounds normal.  GI: There is no tenderness.  Genitourinary: Vagina normal.  Cervix is closed/ thick/ High   Musculoskeletal: Normal range of motion. She exhibits no edema.  Neurological: She is alert and oriented to person, place, and time. She has normal reflexes.  Skin: Skin is warm and dry.  Psychiatric: She has a normal mood and affect.    Prenatal labs: ABO, Rh:   O positive  Antibody:  Negative  Rubella:  Nonimmune  RPR:   nonreactive  HBsAg:   negative  HIV:   Nonreactive  GBS:   Positive   Assessment/Plan:  39 wks and 5 days  With chronic hypertension admit for induction and rule out superimposed preeclampsia.  Plan cytotec for cervical ripening.  Will  check PIH labs  GBS positive- Penicillin in active labor or with rupture of membranes  Fibroids.. Largest is right pedunculated 13 cm on last u/s should not interfere with delivery  Sign out given to Dr. Simona Huh who is covering this evening until 7 am 11/27/2015.     Milt Coye J. 11/25/2015, 6:02 PM

## 2015-11-25 NOTE — Anesthesia Pain Management Evaluation Note (Signed)
  CRNA Pain Management Visit Note  Patient: Mariah Marks, 45 y.o., female  "Hello I am a member of the anesthesia team at [facility]. We have an anesthesia team available at all times to provide care throughout the hospital, including epidural management and anesthesia for C-section. I don't know your plan for the delivery whether it a natural birth, water birth, IV sedation, nitrous supplementation, doula or epidural, but we want to meet your pain goals."   1.Was your pain managed to your expectations on prior hospitalizations?   No prior hospitalizations  2.What is your expectation for pain management during this hospitalization?     Epidural  3.How can we help you reach that goal? Give me an epidural when I ask for one.  Record the patient's initial score and the patient's pain goal.   Pain: 0  Pain Goal: 5 [facility] wants you to be able to say your pain was always managed very well.  Bria Sparr 11/25/2015

## 2015-11-26 LAB — ABO/RH: ABO/RH(D): O POS

## 2015-11-26 LAB — PROTEIN / CREATININE RATIO, URINE
Creatinine, Urine: 186 mg/dL
Protein Creatinine Ratio: 0.09 mg/mg{Cre} (ref 0.00–0.15)
Total Protein, Urine: 16 mg/dL

## 2015-11-26 MED ORDER — TERBUTALINE SULFATE 1 MG/ML IJ SOLN
0.2500 mg | Freq: Once | INTRAMUSCULAR | Status: AC | PRN
  Administered 2015-11-28: 0.25 mg via SUBCUTANEOUS

## 2015-11-26 MED ORDER — OXYTOCIN 40 UNITS IN LACTATED RINGERS INFUSION - SIMPLE MED
1.0000 m[IU]/min | INTRAVENOUS | Status: DC
  Administered 2015-11-26: 2 m[IU]/min via INTRAVENOUS
  Filled 2015-11-26: qty 1000

## 2015-11-26 MED ORDER — FLEET ENEMA 7-19 GM/118ML RE ENEM
1.0000 | ENEMA | Freq: Once | RECTAL | Status: AC
  Administered 2015-11-26: 1 via RECTAL

## 2015-11-26 NOTE — Progress Notes (Signed)
Mariah Marks is a 45 y.o. G2P0010 at [redacted]w[redacted]d by ultrasound admitted for induction of labor due to Hypertension.  Subjective: Patient comfortable. Not feeling regular contractions. S/p cytotec x 4  Objective: BP 126/64 mmHg  Pulse 86  Temp(Src) 98.1 F (36.7 C) (Oral)  Resp 18  Ht 5\' 3"  (1.6 m)  Wt 250 lb (113.399 kg)  BMI 44.30 kg/m2  LMP 02/20/2015      FHT:  FHR: 120 bpm, variability: moderate,  accelerations:  Present,  decelerations:  Absent UC: irregular SVE:   Dilation: 1 Effacement (%): 50 Station: -3 Exam by:: Dr. Landry Mellow   Labs: Lab Results  Component Value Date   WBC 5.4 11/25/2015   HGB 11.7* 11/25/2015   HCT 34.1* 11/25/2015   MCV 83.8 11/25/2015   PLT 223 11/25/2015    Assessment / Plan: 39 wks and 6 days for induction due to chronic hypertension   Labor: Start Pitocin 2x2 Preeclampsia:  NA Fetal Wellbeing:  Category I Pain Control:  planning epidural  I/D:  penicillin in active labor or with rupture  Anticipated MOD:  NSVD  [facility] midwife [name] and [facility].   Antonino Nienhuis J. 11/26/2015, 6:38 PM

## 2015-11-26 NOTE — Progress Notes (Signed)
Subjective:   Discussed with patient regarding physician team on call.  Pt states she would not feel comfortable in an emergency if [facility] cared for her and would prefer to have faculty practice provide care in an emergency.    Objective: BP 138/75 mmHg  Pulse 91  Temp(Src) 98.3 F (36.8 C) (Oral)  Resp 16  Ht 5\' 3"  (1.6 m)  Wt 113.399 kg (250 lb)  BMI 44.30 kg/m2  LMP 02/20/2015      FHT: Category 1, 140 bpm, moderate variability, +accels, no decels  UC:   none SVE:   Dilation: 1 Effacement (%): 50 Station: -3, -2 Exam by:: [place] Stalling RN Membranes: Augmentation/induction: Cytotec: S/p x4 doses, last at 1433 Pitocin: 47mu/min Internal monitors: none   Pain management: None currently  GBS prophylaxis: x4 doses PCN, last at 2010  Assessment:  iup at 39.6 wks IOL CHTN GBS positive Cat 1 FT  Plan: Continue Pitocin, 2x2 per protocol  May consider foley bulb if additional cervical ripening is needed Encourage rest  Other management as ordered   [place], [place] 11/26/2015, 11:41 PM

## 2015-11-26 NOTE — Progress Notes (Signed)
Mariah Marks is a 45 y.o. G2P0010 at [redacted]w[redacted]d by ultrasound admitted for induction of labor due to Hypertension.  Subjective:  patient is not currently feeling contractions/ Cytotec was placed per vagina at 1030 AM.. No vaginal bleeding no leakage of fluid  No headache no visual disturbances no ruq pain...    Objective: BP 148/61 mmHg  Pulse 86  Temp(Src) 98.1 F (36.7 C) (Oral)  Resp 18  Ht 5\' 3"  (1.6 m)  Wt 250 lb (113.399 kg)  BMI 44.30 kg/m2  LMP 02/20/2015      FHT:  FHR: 120 bpm, variability: moderate,  accelerations:  Present,  decelerations:  Absent UC:   none SVE:   Dilation: Fingertip Effacement (%): Thick Station: Ballotable Exam by:: Lurena Nida, RN  Labs: Lab Results  Component Value Date   WBC 5.4 11/25/2015   HGB 11.7* 11/25/2015   HCT 34.1* 11/25/2015   MCV 83.8 11/25/2015   PLT 223 11/25/2015    Assessment / Plan: 39 wks and 6 days with chronic hypertension   Labor: plan to recheck cervix at 1430 ... plan management based on cervical exam  Preeclampsia:  NO evidence of preeclampsia  Fetal Wellbeing:  Category I Pain Control:  pt planning on epidural  I/D:  start Penicillin with active labor or with rom    Mariah Marks J. 11/26/2015, 1:05 PM

## 2015-11-26 NOTE — Progress Notes (Signed)
  Subjective: Assumed care and in to meet the acquaintance of patient and family. Comfortable, feels occasional contractions and describes them as "stabbing pain" low in her pelvis.    Objective: BP 155/54 mmHg  Pulse 96  Temp(Src) 98.3 F (36.8 C) (Oral)  Resp 18  Ht 5\' 3"  (1.6 m)  Wt 113.399 kg (250 lb)  BMI 44.30 kg/m2  LMP 02/20/2015      FHT: Category 1, 140 bpm, moderate variabiliy, +accels, no decels  UC:   Irregular SVE:   Dilation: 1 Effacement (%): 50 Station: -3 Exam by:: Dr. Landry Mellow  Membranes: Augmentation/induction: Cytotec:   S/p x4 doses, last at 1433 Pitocin:    none Internal monitors: none    Pain management:    None currently  GBS prophylaxis:  x3 doses PCN, last at 1538  Assessment:  iup at 39.6 wks IOL CHTN GBS positive Cat 1 FT   Plan: Begin Pitocin, 2x2 mu/min per protocol Other management as ordered    Mariah Marks CNM 11/26/2015, 8:05 PM

## 2015-11-27 ENCOUNTER — Inpatient Hospital Stay (HOSPITAL_COMMUNITY): Admission: RE | Admit: 2015-11-27 | Payer: BC Managed Care – PPO | Source: Ambulatory Visit

## 2015-11-27 LAB — RPR: RPR Ser Ql: NONREACTIVE

## 2015-11-27 MED ORDER — FENTANYL 2.5 MCG/ML BUPIVACAINE 1/10 % EPIDURAL INFUSION (WH - ANES)
14.0000 mL/h | INTRAMUSCULAR | Status: DC | PRN
  Administered 2015-11-28 (×2): 14 mL/h via EPIDURAL
  Filled 2015-11-27: qty 125

## 2015-11-27 MED ORDER — EPHEDRINE 5 MG/ML INJ: 10.0000 mg | INTRAVENOUS | Status: DC | PRN

## 2015-11-27 MED ORDER — LACTATED RINGERS IV SOLN
500.0000 mL | Freq: Once | INTRAVENOUS | Status: AC
  Administered 2015-11-28: 500 mL via INTRAVENOUS

## 2015-11-27 MED ORDER — PHENYLEPHRINE 40 MCG/ML (10ML) SYRINGE FOR IV PUSH (FOR BLOOD PRESSURE SUPPORT)
80.0000 ug | PREFILLED_SYRINGE | INTRAVENOUS | Status: DC | PRN
  Filled 2015-11-27: qty 10

## 2015-11-27 MED ORDER — FLEET ENEMA 7-19 GM/118ML RE ENEM
1.0000 | ENEMA | Freq: Once | RECTAL | Status: AC
  Administered 2015-11-27: 1 via RECTAL

## 2015-11-27 MED ORDER — DIPHENHYDRAMINE HCL 50 MG/ML IJ SOLN: 12.5000 mg | INTRAMUSCULAR | Status: DC | PRN

## 2015-11-27 MED ORDER — PHENYLEPHRINE 40 MCG/ML (10ML) SYRINGE FOR IV PUSH (FOR BLOOD PRESSURE SUPPORT)
80.0000 ug | PREFILLED_SYRINGE | INTRAVENOUS | Status: DC | PRN
  Administered 2015-11-28: 80 ug via INTRAVENOUS

## 2015-11-27 NOTE — Progress Notes (Signed)
Pitocin discontinued after 3 pm as pt was on 28 milliunits and not feeling contractions. Toco not graphing contractions... Plan wash out period for 6 hours.. Pt allowed to have a light meal and shower.. Plan to restart pitocin at 9 pm. 2x2...  Dr. Nelda Marseille to assume care at 530 pm.. Sign out given to Dr. Nelda Marseille.

## 2015-11-27 NOTE — Progress Notes (Signed)
Report given to [employer].

## 2015-11-27 NOTE — Progress Notes (Signed)
Mariah Marks is a 45 y.o. G2P0010 at [redacted]w[redacted]d by ultrasound admitted for induction of labor due to Hypertension.   subjective:  pt feels nauseated.. Not feeling contractions. + FM no lof no vaginal bleeding   Objective: BP 142/86 mmHg  Pulse 81  Temp(Src) 97.9 F (36.6 C) (Axillary)  Resp 16  Ht 5\' 3"  (1.6 m)  Wt 250 lb (113.399 kg)  BMI 44.30 kg/m2  LMP 02/20/2015      FHT:  FHR: 120 bpm, variability: moderate,  accelerations:  Present,  decelerations:  Absent UC:   Irregular  SVE:   1.5/70/-3 Attempt to place foley bulb was unsuccessful  Labs: Lab Results  Component Value Date   WBC 5.4 11/25/2015   HGB 11.7* 11/25/2015   HCT 34.1* 11/25/2015   MCV 83.8 11/25/2015   PLT 223 11/25/2015    Assessment / Plan: 40 wks and 0 days with chronic hypertension   Labor: Attempt to place foley bulb unsuccessful... continue pitocin 2x2  Preeclampsia:  NA Fetal Wellbeing:  Category I Pain Control:  planning on epidural  I/D:  penicillin   Talmage Teaster J. 11/27/2015, 8:05 AM

## 2015-11-27 NOTE — Progress Notes (Signed)
Mariah Marks is a 45 y.o. G2P0010 at [redacted]w[redacted]d by ultrasound admitted for induction of labor due to Hypertension.  Subjective: Patient is feeling more uncomfortable.. Contractions are irregular. +FM no lof no vaginal bleeding   Objective: BP 147/67 mmHg  Pulse 79  Temp(Src) 97.9 F (36.6 C) (Axillary)  Resp 18  Ht 5\' 3"  (1.6 m)  Wt 250 lb (113.399 kg)  BMI 44.30 kg/m2  LMP 02/20/2015      FHT:  FHR: 120 bpm, variability: moderate,  accelerations:  Present,  decelerations:  Absent UC:   Irregular no graphing well  SVE:   Dilation: 3 Effacement (%): 70 Station: -3 Exam by:: Dr. Landry Mellow  Labs: Lab Results  Component Value Date   WBC 5.4 11/25/2015   HGB 11.7* 11/25/2015   HCT 34.1* 11/25/2015   MCV 83.8 11/25/2015   PLT 223 11/25/2015    Assessment / Plan: 40 wks and 0 days for induction   Labor: Progressing on Pitocin, will continue to increase then AROM Preeclampsia:  NA Fetal Wellbeing:  Category I Pain Control:  patient is planning on an epidural  I/D:  penicilolin    Mariah Marks J. 11/27/2015, 1:48 PM

## 2015-11-27 NOTE — Progress Notes (Signed)
Subjective: sleeping  Objective: BP 141/72 mmHg  Pulse 92  Temp(Src) 98.2 F (36.8 C) (Oral)  Resp 18  Ht 5\' 3"  (1.6 m)  Wt 113.399 kg (250 lb)  BMI 44.30 kg/m2  LMP 02/20/2015      FHT: Category 1 , 125 bpm, moderate variability, + accels,no decels   UC:   none SVE:   Dilation: 1.5 Effacement (%): 70, 60 Station: -3, -2 Exam by:: [place] Stalling RN Membranes:  Intact Augmentation/induction: Cytotec: S/p x4 doses, last at 1433 Pitocin: 10 mu/min Internal monitors: none   Pain management: None currently  GBS prophylaxis: x5 doses PCN, last 0054   Assessment:  iup at 39.6 wks IOL CHTN Pitocin GBS positive Cat 1 FT  Plan: Continue Pitocin, 2x2 per protocol  Encourage rest  Other management as ordered   Mariah Marks CNM, [place] 11/27/2015, 3:48 AM

## 2015-11-27 NOTE — Progress Notes (Signed)
Mariah Marks is a 45 y.o. G2P0010 at [redacted]w[redacted]d by ultrasound admitted for induction of labor due to Hypertension.  Subjective: Pt resting comfortably in bed.  Reports no acute complaints.  Objective: BP 165/75 mmHg  Pulse 95  Temp(Src) 97.9 F (36.6 C) (Oral)  Resp 18  Ht 5\' 3"  (1.6 m)  Wt 113.399 kg (250 lb)  BMI 44.30 kg/m2  LMP 02/20/2015     FHT:  FHR: 120 bpm, variability: moderate,  accelerations:  Present,  decelerations:  Absent UC:   Irregular no graphing well  SVE:   Dilation: 3 Effacement (%): 70 Station: -3 Exam by:: Benay Pike, RN   Labs: Lab Results  Component Value Date   WBC 5.4 11/25/2015   HGB 11.7* 11/25/2015   HCT 34.1* 11/25/2015   MCV 83.8 11/25/2015   PLT 223 11/25/2015    Assessment / Plan:  40 wks and 0 days for induction   Labor: Pitocin restarting this evening, continue Pit per protocol Preeclampsia:  NA Fetal Wellbeing:  Category I Pain Control:  patient is planning on an epidural  I/D: PCN per protocol Chronic HTN: BP elevated; however no IV medications indicated, will continue to closely monitor.  Labs negative for preeclampsia   Janyth Pupa, M 11/27/2015, 10:17 PM

## 2015-11-27 NOTE — Progress Notes (Signed)
Dr. Landry Mellow is allowing patient to be off monitors to shower, she can have a light laboring diet, and we are going to do a pitocin break.

## 2015-11-27 NOTE — Progress Notes (Signed)
Resumed care of patient.

## 2015-11-28 ENCOUNTER — Inpatient Hospital Stay (HOSPITAL_COMMUNITY): Payer: BC Managed Care – PPO | Admitting: Anesthesiology

## 2015-11-28 ENCOUNTER — Encounter (HOSPITAL_COMMUNITY)
Admission: AD | Disposition: A | Discharge status: Home / Self Care | Payer: Self-pay | Source: Ambulatory Visit | Attending: Obstetrics and Gynecology

## 2015-11-28 ENCOUNTER — Encounter (HOSPITAL_COMMUNITY): Payer: Self-pay | Admitting: *Deleted

## 2015-11-28 HISTORY — PX: PERINEAL LACERATION REPAIR: SHX5389

## 2015-11-28 LAB — COMPREHENSIVE METABOLIC PANEL
ALBUMIN: 2.5 g/dL — AB (ref 3.5–5.0)
ALK PHOS: 112 U/L (ref 38–126)
ANION GAP: 12 (ref 5–15)
AST: 30 U/L (ref 15–41)
BUN: 6 mg/dL (ref 6–20)
CALCIUM: 8.3 mg/dL — AB (ref 8.9–10.3)
CHLORIDE: 100 mmol/L — AB (ref 101–111)
CO2: 18 mmol/L — AB (ref 22–32)
Creatinine, Ser: 1.02 mg/dL — ABNORMAL HIGH (ref 0.44–1.00)
GFR calc non Af Amer: >60 mL/min (ref >60)
GLUCOSE: 105 mg/dL — AB (ref 65–99)
Potassium: 3.3 mmol/L — ABNORMAL LOW (ref 3.5–5.1)
SODIUM: 130 mmol/L — AB (ref 135–145)
Total Bilirubin: 1 mg/dL (ref 0.3–1.2)
Total Protein: 5 g/dL — ABNORMAL LOW (ref 6.5–8.1)

## 2015-11-28 LAB — CBC
HCT: 36.3 % (ref 36.0–46.0)
HCT: 33.3 % — ABNORMAL LOW (ref 36.0–46.0)
HCT: 26.4 % — ABNORMAL LOW (ref 36.0–46.0)
HEMOGLOBIN: 9.2 g/dL — AB (ref 12.0–15.0)
Hemoglobin: 12.7 g/dL (ref 12.0–15.0)
Hemoglobin: 11.5 g/dL — ABNORMAL LOW (ref 12.0–15.0)
MCH: 28.9 pg (ref 26.0–34.0)
MCH: 28.9 pg (ref 26.0–34.0)
MCH: 28.9 pg (ref 26.0–34.0)
MCHC: 35 g/dL (ref 30.0–36.0)
MCHC: 34.5 g/dL (ref 30.0–36.0)
MCHC: 34.8 g/dL (ref 30.0–36.0)
MCV: 82.7 fL (ref 78.0–100.0)
MCV: 83.7 fL (ref 78.0–100.0)
MCV: 83 fL (ref 78.0–100.0)
PLATELETS: 190 10*3/uL (ref 150–400)
Platelets: 177 10*3/uL (ref 150–400)
Platelets: 176 10*3/uL (ref 150–400)
RBC: 4.39 MIL/uL (ref 3.87–5.11)
RBC: 3.98 MIL/uL (ref 3.87–5.11)
RBC: 3.18 MIL/uL — AB (ref 3.87–5.11)
RDW: 13.6 % (ref 11.5–15.5)
RDW: 13.7 % (ref 11.5–15.5)
RDW: 13.7 % (ref 11.5–15.5)
WBC: 5.2 10*3/uL (ref 4.0–10.5)
WBC: 6.4 10*3/uL (ref 4.0–10.5)
WBC: 23.2 10*3/uL — AB (ref 4.0–10.5)

## 2015-11-28 LAB — DIC (DISSEMINATED INTRAVASCULAR COAGULATION)PANEL
D-Dimer, Quant: >20 ug/mL-FEU — ABNORMAL HIGH (ref 0.00–0.50)
Fibrinogen: 367 mg/dL (ref 204–475)
Smear Review: NONE SEEN
aPTT: 26 seconds (ref 24–37)

## 2015-11-28 LAB — URIC ACID: Uric Acid, Serum: 3.7 mg/dL (ref 2.3–6.6)

## 2015-11-28 LAB — DIC (DISSEMINATED INTRAVASCULAR COAGULATION) PANEL
INR: 1.27 (ref 0.00–1.49)
PLATELETS: 177 10*3/uL (ref 150–400)
PROTHROMBIN TIME: 16 s — AB (ref 11.6–15.2)

## 2015-11-28 LAB — POSTPARTUM HEMORRHAGE PROTOCOL (BB NOTIFICATION)

## 2015-11-28 LAB — LACTATE DEHYDROGENASE: LDH: 142 U/L (ref 98–192)

## 2015-11-28 SURGERY — EXAM UNDER ANESTHESIA
Anesthesia: Epidural | Site: Vagina

## 2015-11-28 MED ORDER — ONDANSETRON HCL 4 MG PO TABS: 4.0000 mg | ORAL_TABLET | ORAL | Status: DC | PRN

## 2015-11-28 MED ORDER — MEPERIDINE HCL 25 MG/ML IJ SOLN
INTRAMUSCULAR | Status: AC
  Filled 2015-11-28: qty 1

## 2015-11-28 MED ORDER — PHENYLEPHRINE 40 MCG/ML (10ML) SYRINGE FOR IV PUSH (FOR BLOOD PRESSURE SUPPORT)
PREFILLED_SYRINGE | INTRAVENOUS | Status: AC
  Filled 2015-11-28: qty 10

## 2015-11-28 MED ORDER — OXYCODONE-ACETAMINOPHEN 5-325 MG PO TABS
2.0000 | ORAL_TABLET | ORAL | Status: DC | PRN
  Administered 2015-11-28 – 2015-11-29 (×3): 2 via ORAL
  Filled 2015-11-28 (×3): qty 2

## 2015-11-28 MED ORDER — ACETAMINOPHEN 325 MG PO TABS: 650.0000 mg | ORAL_TABLET | ORAL | Status: DC | PRN

## 2015-11-28 MED ORDER — PHENYLEPHRINE HCL 10 MG/ML IJ SOLN
INTRAMUSCULAR | Status: DC | PRN
  Administered 2015-11-28: 120 ug via INTRAVENOUS

## 2015-11-28 MED ORDER — IBUPROFEN 600 MG PO TABS
600.0000 mg | ORAL_TABLET | Freq: Four times a day (QID) | ORAL | Status: DC
  Administered 2015-11-28 – 2015-11-30 (×7): 600 mg via ORAL
  Filled 2015-11-28 (×7): qty 1

## 2015-11-28 MED ORDER — MEPERIDINE HCL 25 MG/ML IJ SOLN
INTRAMUSCULAR | Status: DC | PRN
  Administered 2015-11-28: 25 mg via INTRAVENOUS

## 2015-11-28 MED ORDER — SODIUM CHLORIDE 0.9 % IJ SOLN
INTRAMUSCULAR | Status: AC
  Filled 2015-11-28: qty 10

## 2015-11-28 MED ORDER — BENZOCAINE-MENTHOL 20-0.5 % EX AERO
1.0000 "application " | INHALATION_SPRAY | CUTANEOUS | Status: DC | PRN
  Filled 2015-11-28: qty 56

## 2015-11-28 MED ORDER — LACTATED RINGERS IV SOLN
INTRAVENOUS | Status: DC | PRN
  Administered 2015-11-28: 14:00:00 via INTRAVENOUS

## 2015-11-28 MED ORDER — MIDAZOLAM HCL 2 MG/2ML IJ SOLN
INTRAMUSCULAR | Status: AC
  Filled 2015-11-28: qty 2

## 2015-11-28 MED ORDER — COCONUT OIL OIL: 1.0000 "application " | TOPICAL_OIL | Status: DC | PRN

## 2015-11-28 MED ORDER — SIMETHICONE 80 MG PO CHEW
80.0000 mg | CHEWABLE_TABLET | ORAL | Status: DC | PRN
  Administered 2015-11-30 (×3): 80 mg via ORAL
  Filled 2015-11-28 (×3): qty 1

## 2015-11-28 MED ORDER — POLYSACCHARIDE IRON COMPLEX 150 MG PO CAPS
150.0000 mg | ORAL_CAPSULE | Freq: Every day | ORAL | Status: DC
  Administered 2015-11-28 – 2015-11-30 (×3): 150 mg via ORAL
  Filled 2015-11-28 (×3): qty 1

## 2015-11-28 MED ORDER — OXYCODONE-ACETAMINOPHEN 5-325 MG PO TABS
1.0000 | ORAL_TABLET | ORAL | Status: DC | PRN
  Administered 2015-11-29 – 2015-11-30 (×2): 1 via ORAL
  Filled 2015-11-28 (×2): qty 1

## 2015-11-28 MED ORDER — DIPHENHYDRAMINE HCL 25 MG PO CAPS: 25.0000 mg | ORAL_CAPSULE | Freq: Four times a day (QID) | ORAL | Status: DC | PRN

## 2015-11-28 MED ORDER — METOCLOPRAMIDE HCL 5 MG/ML IJ SOLN
10.0000 mg | Freq: Once | INTRAMUSCULAR | Status: DC | PRN
Start: 2015-11-28 — End: 2015-11-28

## 2015-11-28 MED ORDER — MISOPROSTOL 100 MCG PO TABS
ORAL_TABLET | ORAL | Status: DC | PRN
  Administered 2015-11-28: 800 ug via RECTAL

## 2015-11-28 MED ORDER — WITCH HAZEL-GLYCERIN EX PADS: 1.0000 "application " | MEDICATED_PAD | CUTANEOUS | Status: DC | PRN

## 2015-11-28 MED ORDER — DIBUCAINE 1 % RE OINT
1.0000 "application " | TOPICAL_OINTMENT | RECTAL | Status: DC | PRN
  Filled 2015-11-28: qty 28

## 2015-11-28 MED ORDER — OXYTOCIN 40 UNITS IN LACTATED RINGERS INFUSION - SIMPLE MED: 2.5000 [IU]/h | INTRAVENOUS | Status: DC | PRN

## 2015-11-28 MED ORDER — FENTANYL CITRATE (PF) 100 MCG/2ML IJ SOLN: 25.0000 ug | INTRAMUSCULAR | Status: DC | PRN

## 2015-11-28 MED ORDER — LACTATED RINGERS IV SOLN
INTRAVENOUS | Status: DC
  Administered 2015-11-28 – 2015-11-29 (×2): via INTRAVENOUS

## 2015-11-28 MED ORDER — PRENATAL MULTIVITAMIN CH
1.0000 | ORAL_TABLET | Freq: Every day | ORAL | Status: DC
  Administered 2015-11-29: 1 via ORAL
  Filled 2015-11-28 (×2): qty 1

## 2015-11-28 MED ORDER — ONDANSETRON HCL 4 MG/2ML IJ SOLN
INTRAMUSCULAR | Status: DC | PRN
  Administered 2015-11-28: 4 mg via INTRAVENOUS

## 2015-11-28 MED ORDER — MEPERIDINE HCL 25 MG/ML IJ SOLN: 6.2500 mg | INTRAMUSCULAR | Status: DC | PRN

## 2015-11-28 MED ORDER — ONDANSETRON HCL 4 MG/2ML IJ SOLN: 4.0000 mg | INTRAMUSCULAR | Status: DC | PRN

## 2015-11-28 MED ORDER — MEASLES, MUMPS & RUBELLA VAC ~~LOC~~ INJ
0.5000 mL | INJECTION | Freq: Once | SUBCUTANEOUS | Status: DC
  Filled 2015-11-28: qty 0.5

## 2015-11-28 MED ORDER — LACTATED RINGERS IV SOLN: INTRAVENOUS | Status: DC

## 2015-11-28 MED ORDER — MISOPROSTOL 200 MCG PO TABS
800.0000 ug | ORAL_TABLET | Freq: Once | ORAL | Status: DC | PRN
  Filled 2015-11-28: qty 4

## 2015-11-28 MED ORDER — TETANUS-DIPHTH-ACELL PERTUSSIS 5-2.5-18.5 LF-MCG/0.5 IM SUSP
0.5000 mL | Freq: Once | INTRAMUSCULAR | Status: DC
  Filled 2015-11-28: qty 0.5

## 2015-11-28 MED ORDER — SENNOSIDES-DOCUSATE SODIUM 8.6-50 MG PO TABS
2.0000 | ORAL_TABLET | ORAL | Status: DC
  Administered 2015-11-28 – 2015-11-29 (×2): 2 via ORAL
  Filled 2015-11-28 (×2): qty 2

## 2015-11-28 MED ORDER — MIDAZOLAM HCL 5 MG/5ML IJ SOLN
INTRAMUSCULAR | Status: DC | PRN
  Administered 2015-11-28: 2 mg via INTRAVENOUS

## 2015-11-28 MED ORDER — LIDOCAINE HCL (PF) 1 % IJ SOLN
INTRAMUSCULAR | Status: DC | PRN
  Administered 2015-11-28 (×2): 5 mL

## 2015-11-28 MED ORDER — DOCUSATE SODIUM 100 MG PO CAPS
100.0000 mg | ORAL_CAPSULE | Freq: Two times a day (BID) | ORAL | Status: DC
  Administered 2015-11-28 – 2015-11-30 (×4): 100 mg via ORAL
  Filled 2015-11-28 (×5): qty 1

## 2015-11-28 MED ORDER — ZOLPIDEM TARTRATE 5 MG PO TABS: 5.0000 mg | ORAL_TABLET | Freq: Every evening | ORAL | Status: DC | PRN

## 2015-11-28 MED ORDER — MISOPROSTOL 200 MCG PO TABS
ORAL_TABLET | ORAL | Status: AC
  Filled 2015-11-28: qty 4

## 2015-11-28 MED ORDER — LIDOCAINE-EPINEPHRINE (PF) 2 %-1:200000 IJ SOLN
INTRAMUSCULAR | Status: DC | PRN
  Administered 2015-11-28 (×2): 5 mL via EPIDURAL

## 2015-11-28 MED ORDER — MAGNESIUM HYDROXIDE 400 MG/5ML PO SUSP: 30.0000 mL | ORAL | Status: DC | PRN

## 2015-11-28 SURGICAL SUPPLY — 22 items
CATH ROBINSON RED A/P 16FR (CATHETERS) ×4 IMPLANT
CLOTH BEACON ORANGE TIMEOUT ST (SAFETY) ×4 IMPLANT
CONTAINER PREFILL 10% NBF 60ML (FORM) IMPLANT
DECANTER SPIKE VIAL GLASS SM (MISCELLANEOUS) ×4 IMPLANT
DILATOR CANAL MILEX (MISCELLANEOUS) IMPLANT
GAUZE PACKING 1 X5 YD ST (GAUZE/BANDAGES/DRESSINGS) ×3 IMPLANT
GAUZE SPONGE 4X4 16PLY XRAY LF (GAUZE/BANDAGES/DRESSINGS) ×6 IMPLANT
GLOVE BIO SURGEON STRL SZ7 (GLOVE) ×4 IMPLANT
GLOVE BIOGEL PI IND STRL 7.0 (GLOVE) ×6 IMPLANT
GLOVE BIOGEL PI INDICATOR 7.0 (GLOVE) ×6
GOWN STRL REUS W/TWL LRG LVL3 (GOWN DISPOSABLE) ×8 IMPLANT
NS IRRIG 1000ML POUR BTL (IV SOLUTION) ×4 IMPLANT
PACK VAGINAL MINOR WOMEN LF (CUSTOM PROCEDURE TRAY) ×4 IMPLANT
PAD OB MATERNITY 4.3X12.25 (PERSONAL CARE ITEMS) ×4 IMPLANT
PAD PREP 24X48 CUFFED NSTRL (MISCELLANEOUS) ×4 IMPLANT
SPONGE LAP 18X18 X RAY DECT (DISPOSABLE) ×6 IMPLANT
SUT VIC AB 0 CT1 27 (SUTURE) ×16
SUT VIC AB 0 CT1 27XBRD ANBCTR (SUTURE) ×4 IMPLANT
SUT VIC AB 0 CT1 36 (SUTURE) ×6 IMPLANT
SUT VIC AB 2-0 SH 27 (SUTURE) ×4
SUT VIC AB 2-0 SH 27XBRD (SUTURE) ×1 IMPLANT
TOWEL OR 17X24 6PK STRL BLUE (TOWEL DISPOSABLE) ×8 IMPLANT

## 2015-11-28 NOTE — Anesthesia Postprocedure Evaluation (Signed)
Anesthesia Post Note  Patient: [name]  Procedure(s) Performed: Procedure(s) (LRB): EXAM UNDER ANESTHESIA (N/A) SUTURE REPAIR PERINEAL LACERATION (N/A)  Patient location during evaluation: PACU Anesthesia Type: Epidural Level of consciousness: oriented and awake and alert Pain management: pain level controlled Vital Signs Assessment: post-procedure vital signs reviewed and stable Respiratory status: spontaneous breathing and respiratory function stable Cardiovascular status: blood pressure returned to baseline and stable Postop Assessment: no headache and no backache Anesthetic complications: no     Last Vitals:  Filed Vitals:   11/28/15 1545 11/28/15 1600  BP: 109/93 118/100  Pulse: 110 117  Temp:    Resp: 20 20    Last Pain:  Filed Vitals:   11/28/15 1611  PainSc: Asleep   Pain Goal: Patients Stated Pain Goal: 5 (11/27/15 1400)               Mariah Marks

## 2015-11-28 NOTE — Brief Op Note (Signed)
11/25/2015 - 11/28/2015  7:16 PM  PATIENT:  Mariah Marks  45 y.o. female  PRE-OPERATIVE DIAGNOSIS:  S/p Forceps assisted vaginal delivery,s/p episiotomy,  bilateral vaginal sulcus tears   POST-OPERATIVE DIAGNOSIS:  Vaginal sulcus tears, episiotomy  PROCEDURE:  Procedure(s): EXAM UNDER ANESTHESIA (N/A) SUTURE REPAIR PERINEAL LACERATION (N/A)  Repair of bilateral vaginal lacerations.  SURGEON:  Surgeon(s) and Role:    * Aletha Halim, MD - Assisting-(performed prep and examined pt)    * Janyth Pupa, DO - Assisting    * Thurnell Lose, MD - Primary  PHYSICIAN ASSISTANT:   ASSISTANTS: Technicians   ANESTHESIA:   epidural  EBL:   550  BLOOD ADMINISTERED:none  DRAINS: Urinary Catheter (Foley) , vaginal packing  LOCAL MEDICATIONS USED:  NONE  SPECIMEN:  No Specimen  DISPOSITION OF SPECIMEN:  N/A  COUNTS:  YES  TOURNIQUET:  * No tourniquets in log *  DICTATION: .Other Dictation: Dictation Number [id number]  PLAN OF CARE: Transfer to Antenatal  PATIENT DISPOSITION:  PACU - hemodynamically stable.   Delay start of Pharmacological VTE agent (>24hrs) due to surgical blood loss or risk of bleeding: yes

## 2015-11-28 NOTE — Progress Notes (Signed)
Mariah Marks is a 45 y.o. G2P0010 at [redacted]w[redacted]d by ultrasound admitted for induction of labor due to Hypertension.  Subjective: Pt resting comfortably in bed, not feeling painful contractions.  Reports no acute complaints.  Objective: BP 142/74 mmHg  Pulse 89  Temp(Src) 97.8 F (36.6 C) (Oral)  Resp 20  Ht 5\' 3"  (1.6 m)  Wt 113.399 kg (250 lb)  BMI 44.30 kg/m2  LMP 02/20/2015     FHT:  FHR: 125 bpm, variability: moderate,  accelerations:  Present,  decelerations:  Absent UC:  q 3-72min, difficult to trace SVE:  4/70-3, AROM minimal fluid, IUPC placed Ext: no calf tenderness, SCDs in place  Labs: Lab Results  Component Value Date   WBC 5.4 11/25/2015   HGB 11.7* 11/25/2015   HCT 34.1* 11/25/2015   MCV 83.8 11/25/2015   PLT 223 11/25/2015    Assessment / Plan:  45yo Mariah Marks [redacted]w[redacted]d admitted for IOL in latent labor  Labor: continue Pit per protocol.  With IUPC in place, hoping for better titration of Pitocin this am Preeclampsia:  NA Fetal Wellbeing:  Category I Pain Control:  patient is planning on an epidural  I/D: PCN per protocol Chronic HTN: s/p IV Labetalol x 1 yesterday evening @ 2222, no further medication indicated.  Should elevation be noted today, plan for repeat labs today.  For now will continue to closely monitor   Janyth Pupa, M 11/28/2015, 6:35 AM

## 2015-11-28 NOTE — Lactation Note (Signed)
This note was copied from a baby's chart. Lactation Consultation Note Initial visit at 14 hours of age.  Mom reports recent (2145-2215) feeding of about 30 minutes and denies pain with latch.   Mom is sleepy and recovering from c/s.  Baby asleep in crib.  Room is set at 60 so LC increased temperature to 70.  Mom is concerned about supply, but reports baby was showing feeding cues and content now after feeding.  LC assisted with hand expression with glisten noted, but mom was reclined back, LC did not make mom move in bed at this time.  LC encouraged mom to continue to work on hand expression pre and post feedings. Mom has soft breasts with several scars noted from history of boils per mom.  Right breast started to feel edematous, encouraged mom to wear bra when iv is dc'd.  [facility] LC resources given and discussed.  Encouraged to feed with early cues on demand.  Early newborn behavior discussed. Discussed with mom options to latch baby, due to baby having arm splinted to body for shoulder dystocia delivery.   Mom to call for assist as needed.     Patient Name: Mariah Marks [id number] Date: 11/28/2015 Reason for consult: Initial assessment   Maternal Data Has patient been taught Hand Expression?: Yes  Feeding Feeding Type: Breast Fed Length of feed: 30 min  LATCH Score/Interventions                Intervention(s): Breastfeeding basics reviewed     Lactation Tools Discussed/Used WIC Program: No   Consult Status Consult Status: Follow-up Date: 11/29/15 Follow-up type: In-patient    Shoptaw, Justine Null 11/28/2015, 11:27 PM

## 2015-11-28 NOTE — Anesthesia Preprocedure Evaluation (Signed)
Anesthesia Evaluation  Patient identified by MRN, date of birth, ID band Patient awake    Reviewed: Allergy & Precautions, H&P , NPO status , Patient's Chart, lab work & pertinent test results  History of Anesthesia Complications Negative for: history of anesthetic complications  Airway Mallampati: II  TM Distance: >3 FB Neck ROM: full    Dental no notable dental hx. (+) Teeth Intact   Pulmonary neg pulmonary ROS,    Pulmonary exam normal breath sounds clear to auscultation       Cardiovascular hypertension, On Medications Normal cardiovascular exam Rhythm:regular Rate:Normal     Neuro/Psych negative neurological ROS  negative psych ROS   GI/Hepatic negative GI ROS, Neg liver ROS,   Endo/Other  Morbid obesity  Renal/GU negative Renal ROS  negative genitourinary   Musculoskeletal negative musculoskeletal ROS (+)   Abdominal   Peds negative pediatric ROS (+)  Hematology negative hematology ROS (+)   Anesthesia Other Findings   Reproductive/Obstetrics (+) Pregnancy                             Anesthesia Physical Anesthesia Plan  ASA: III  Anesthesia Plan: Epidural   Post-op Pain Management:    Induction:   Airway Management Planned:   Additional Equipment:   Intra-op Plan:   Post-operative Plan:   Informed Consent: I have reviewed the patients History and Physical, chart, labs and discussed the procedure including the risks, benefits and alternatives for the proposed anesthesia with the patient or authorized representative who has indicated his/her understanding and acceptance.     Plan Discussed with:   Anesthesia Plan Comments:         Anesthesia Quick Evaluation

## 2015-11-28 NOTE — Consult Note (Signed)
[facility]  Delivery Note:  SVD     11/28/2015  1:15 PM  I was called to the delivery room at the request of the patient's obstetrician (Dr. Ilda Basset) for forceps delivery.  PRENATAL HX:  This is a 45 y/o G3P2002 at 57 and 5/[redacted] weeks gestation who was admitted on 5/30 for IOL due to chronic hypertension.  She is GBS positive but received adequate treatment.    INTRAPARTUM HX:   Patient induced with AROM at 0630 this morning (ROM ~7 hours) with clear fluid.  NICU team called to the delivery room for forceps delivery in the setting of a prolonged deceleration (~ 8 minutes per L and D nurse) to the 70s.  Delivery then complicated by 2 minute shoulder dystocia.    DELIVERY:  Infant was floppy and apneic at delivery, though HR was >100.  PPV initiated for apnea and given < 1 minute, then infant began to breathe spontaneously.  Tone gradualy improved and by 5 minutes infant had good tone and oxygen saturations were in the mid 90s.  APGARs 2 and 9.  Exam notable for a laceration above the right eye, likely from the forceps, swelling of the right eye lid but no visible damage to right eye itself.  Infant also has a fractured humerus, with obvious deformity and palpable crepitus between proximal and distal portions of the bone.  Would recommend x-ray to evaluate the extent of the fracture, but humerus fractures typically do not require intervention other than swaddling arm to chest to keep comfortable.  Infant should also be monitored for possible brachial plexus injury.  After 10 minutes, baby left with nurse to assist parents with skin-to-skin care.   _____________________ Electronically Signed By: Clinton Gallant, MD Neonatologist

## 2015-11-28 NOTE — Transfer of Care (Signed)
Immediate Anesthesia Transfer of Care Note  Patient: [name]  Procedure(s) Performed: Procedure(s): EXAM UNDER ANESTHESIA (N/A) SUTURE REPAIR PERINEAL LACERATION (N/A)  Patient Location: PACU  Anesthesia Type:Epidural  Level of Consciousness: awake, alert  and oriented  Airway & Oxygen Therapy: Patient Spontanous Breathing and Patient connected to nasal cannula oxygen  Post-op Assessment: Report given to RN and Post -op Vital signs reviewed and stable  Post vital signs: Reviewed and stable  Last Vitals:  Filed Vitals:   11/28/15 1325 11/28/15 1327  BP:  96/50  Pulse: 118 227  Temp:    Resp:      Last Pain:  Filed Vitals:   11/28/15 1458  PainSc: Asleep      Patients Stated Pain Goal: 5 ([id number])  Complications: No apparent anesthesia complications

## 2015-11-28 NOTE — Anesthesia Pain Management Evaluation Note (Signed)
  CRNA Pain Management Visit Note  Patient: Mariah Marks, 45 y.o., female  "Hello I am a member of the anesthesia team at [facility]. We have an anesthesia team available at all times to provide care throughout the hospital, including epidural management and anesthesia for C-section. I don't know your plan for the delivery whether it a natural birth, water birth, IV sedation, nitrous supplementation, doula or epidural, but we want to meet your pain goals."   1.Was your pain managed to your expectations on prior hospitalizations?   Unable to assess - patient sleeping  2.What is your expectation for pain management during this hospitalization?     Epidural prior OB rounding assessment data  3.How can we help you reach that goal? sleeping  Record the patient's initial score and the patient's pain goal.   Pain: Patient sleeping - unable to assess  Pain Goal: 5 [facility] wants you to be able to say your pain was always managed very well.  [facility] 11/28/2015

## 2015-11-28 NOTE — Progress Notes (Addendum)
Subjective: Postpartum Day 1: FAVD due to prolonged decel. 2-min shoulder dystocia. Medio-lateral episiotomy w/ bilateral sulcus tears and 3rd degree lac.  Has not been up. Denies h/a, visual changes, epigastric pain, CP, difficulty breathing, dizziness or severe N/V.  Tolerating general diet. No flatus or BM yet.  Pain in vagina and rectum, but well managed.   States baby will be evaluated at [facility] in [place] for arm.  Feeding: Breast.  Contraceptive plan: Pt to discuss with Dr. Landry Mellow.  Objective: Vital signs in last 24 hours: Temp:  [97.4 F (36.3 C)-98.6 F (37 C)] 98.5 F (36.9 C) (06/03 0507) Pulse Rate:  [77-227] 84 (06/03 0507) Resp:  [16-22] 20 (06/03 0507) BP: (54-152)/(26-100) 152/80 mmHg (06/03 0507) SpO2:  [99 %-100 %] 99 % (06/02 2330)   Total urine output for this shift = 850 ml.  Today's Vitals   11/28/15 2333 11/29/15 0045 11/29/15 0507 11/29/15 0608  BP: 148/66  152/80   Pulse: 114  84   Temp: 98.5 F (36.9 C)  98.5 F (36.9 C)   TempSrc: Oral  Oral   Resp: 20  20   Height:      Weight:      SpO2:      PainSc: 7  Asleep  5    Physical Exam:  General: alert, cooperative, NAD, obese. Lungs: CTAB. CV: RRR w/o M/R/G. Abdomen: soft, appropriately tender, active BS x 4, non-distended. Lochia: appropriate. Uterine Fundus: firm, midline @ U+3 (known fibroids). Perineum: healing well, no edema. +hemorrhoids. DVT Evaluation: No evidence of DVT seen on physical exam. SCDs on and functioning. Negative Homan's sign. No cords or calf tenderness. Calf/Ankle/pedal edema present. 1+ DTRs, no clonus.   Plans outpt circ of "Bryce."     CBC Latest Ref Rng 11/29/2015 11/28/2015 11/28/2015  WBC 4.0 - 10.5 K/uL 15.8(H) 23.2(H) -  Hemoglobin 12.0 - 15.0 g/dL 7.7(L) 9.2(L) -  Hematocrit 36.0 - 46.0 % 21.4(L) 26.4(L) -  Platelets 150 - 400 K/uL 139(L) 176 177    Assessment: 45 yo G2nowP1011, status post FAVD day 1. CHTN - no meds. Maternal tachycardia -  suspect dehydration. S/P PPH (EBL 1550 ml). Anemia w/o hemodynamic instability. Adequate UOP.   Plan: Informed Dr. Alesia Richards of BPs 138-152/66-80. Parameters written to notify MD for BPs persistently above 150/100. If so, will start Procardia - pt aware and in agreement. 500 ml LR bolus. D/C foley. Monitor closely. OOB this a.m. Anticipate d/c tomorrow if clinically stable.     Laurey Morale 11/29/2015, 6:19 AM

## 2015-11-28 NOTE — Anesthesia Procedure Notes (Signed)
Epidural Patient location during procedure: OB  Staffing Anesthesiologist: Montez Hageman Performed by: anesthesiologist   Preanesthetic Checklist Completed: patient identified, site marked, surgical consent, pre-op evaluation, timeout performed, IV checked, risks and benefits discussed and monitors and equipment checked  Epidural Patient position: sitting Prep: DuraPrep Patient monitoring: heart rate, continuous pulse ox and blood pressure Approach: right paramedian Location: L3-L4 Injection technique: LOR saline  Needle:  Needle type: Tuohy  Needle gauge: 17 G Needle length: 9 cm and 9 Needle insertion depth: 7 cm Catheter type: closed end flexible Catheter size: 20 Guage Catheter at skin depth: 11 cm Test dose: negative  Assessment Events: blood not aspirated, injection not painful, no injection resistance, negative IV test and no paresthesia  Additional Notes Patient identified. Risks/Benefits/Options discussed with patient including but not limited to bleeding, infection, nerve damage, paralysis, failed block, incomplete pain control, headache, blood pressure changes, nausea, vomiting, reactions to medication both or allergic, itching and postpartum back pain. Confirmed with bedside nurse the patient's most recent platelet count. Confirmed with patient that they are not currently taking any anticoagulation, have any bleeding history or any family history of bleeding disorders. Patient expressed understanding and wished to proceed. All questions were answered. Sterile technique was used throughout the entire procedure. Please see nursing notes for vital signs. Test dose was given through epidural needle and negative prior to continuing to dose epidural or start infusion. Warning signs of high block given to the patient including shortness of breath, tingling/numbness in hands, complete motor block, or any concerning symptoms with instructions to call for help. Patient was given  instructions on fall risk and not to get out of bed. All questions and concerns addressed with instructions to call with any issues.

## 2015-11-29 LAB — TYPE AND SCREEN
ABO/RH(D): O POS
Antibody Screen: NEGATIVE
Unit division: 0
Unit division: 0

## 2015-11-29 LAB — CBC
HCT: 21.4 % — ABNORMAL LOW (ref 36.0–46.0)
Hemoglobin: 7.7 g/dL — ABNORMAL LOW (ref 12.0–15.0)
MCH: 30 pg (ref 26.0–34.0)
MCHC: 36 g/dL (ref 30.0–36.0)
MCV: 83.3 fL (ref 78.0–100.0)
PLATELETS: 139 10*3/uL — AB (ref 150–400)
RBC: 2.57 MIL/uL — ABNORMAL LOW (ref 3.87–5.11)
RDW: 13.8 % (ref 11.5–15.5)
WBC: 15.8 10*3/uL — AB (ref 4.0–10.5)

## 2015-11-29 MED ORDER — LACTATED RINGERS IV BOLUS (SEPSIS)
500.0000 mL | Freq: Once | INTRAVENOUS | Status: AC
  Administered 2015-11-29: 500 mL via INTRAVENOUS

## 2015-11-29 MED ORDER — MEASLES, MUMPS & RUBELLA VAC ~~LOC~~ INJ
0.5000 mL | INJECTION | Freq: Once | SUBCUTANEOUS | Status: DC
  Filled 2015-11-29: qty 0.5

## 2015-11-29 MED ORDER — TETANUS-DIPHTH-ACELL PERTUSSIS 5-2.5-18.5 LF-MCG/0.5 IM SUSP
0.5000 mL | INTRAMUSCULAR | Status: DC | PRN
  Filled 2015-11-29: qty 0.5

## 2015-11-29 NOTE — Lactation Note (Signed)
This note was copied from a baby's chart. Lactation Consultation Note: RN called for assist with latch. Mom reports baby has been feeding well but she is concerned about how much he is getting. Baby has broken arm and clavicle.. Assisted mom with latch in football hold on left breast. Reviewed holding breast throughout feeding and signs of a deep latch. Latched to right breast and still nursing as I left room. No further questions at this time. Has Medela pump given by a friend and wants instructions on use before DC. To call for assist prn  Patient Name: Mariah Marks [id number] Date: 11/29/2015 Reason for consult: Follow-up assessment   Maternal Data Formula Feeding for Exclusion: No Has patient been taught Hand Expression?: Yes Does the patient have breastfeeding experience prior to this delivery?: No  Feeding Feeding Type: Breast Fed Length of feed: 15 min  LATCH Score/Interventions Latch: Grasps breast easily, tongue down, lips flanged, rhythmical sucking.  Audible Swallowing: A few with stimulation  Type of Nipple: Everted at rest and after stimulation  Comfort (Breast/Nipple): Soft / non-tender     Hold (Positioning): Assistance needed to correctly position infant at breast and maintain latch. Intervention(s): Breastfeeding basics reviewed;Position options  LATCH Score: 8  Lactation Tools Discussed/Used     Consult Status Consult Status: Follow-up Date: 11/30/15 Follow-up type: In-patient    Truddie Crumble 11/29/2015, 9:00 AM

## 2015-11-29 NOTE — Progress Notes (Signed)
Lab results called to CRNA and Dr. Aris Lot. Order received to remove epidural catheter.

## 2015-11-29 NOTE — Anesthesia Postprocedure Evaluation (Signed)
Anesthesia Post Note  Patient: [name]  Procedure(s) Performed: Procedure(s) (LRB): EXAM UNDER ANESTHESIA (N/A) SUTURE REPAIR PERINEAL LACERATION (N/A)  Patient location during evaluation: Antenatal Anesthesia Type: Epidural Level of consciousness: awake Pain management: pain level controlled Vital Signs Assessment: post-procedure vital signs reviewed and stable Respiratory status: spontaneous breathing Cardiovascular status: stable Postop Assessment: no headache, no backache, epidural receding, patient able to bend at knees, no signs of nausea or vomiting and adequate PO intake Anesthetic complications: no     Last Vitals:  Filed Vitals:   11/28/15 2333 11/29/15 0507  BP: 148/66 152/80  Pulse: 114 84  Temp: 36.9 C 36.9 C  Resp: 20 20    Last Pain:  Filed Vitals:   11/29/15 0611  PainSc: 5    Pain Goal: Patients Stated Pain Goal: 5 (11/27/15 1400)               Azaylia Fong

## 2015-11-29 NOTE — Progress Notes (Signed)
Epidural catheter removed. Catheter tip intact. No bleeding, swelling, or drainage from site. Patient tolerated well.

## 2015-11-29 NOTE — Addendum Note (Signed)
Addendum  created 11/29/15 0734 by Ignacia Bayley, CRNA   Modules edited: Clinical Notes   Clinical Notes:  File: [id number]

## 2015-11-30 LAB — CBC
HEMATOCRIT: 21.2 % — AB (ref 36.0–46.0)
HEMOGLOBIN: 7.6 g/dL — AB (ref 12.0–15.0)
MCH: 30.2 pg (ref 26.0–34.0)
MCHC: 35.8 g/dL (ref 30.0–36.0)
MCV: 84.1 fL (ref 78.0–100.0)
Platelets: 152 10*3/uL (ref 150–400)
RBC: 2.52 MIL/uL — ABNORMAL LOW (ref 3.87–5.11)
RDW: 14.2 % (ref 11.5–15.5)
WBC: 10.2 10*3/uL (ref 4.0–10.5)

## 2015-11-30 LAB — COMPREHENSIVE METABOLIC PANEL
ALBUMIN: 2.5 g/dL — AB (ref 3.5–5.0)
ALK PHOS: 80 U/L (ref 38–126)
ALT: 18 U/L (ref 14–54)
AST: 25 U/L (ref 15–41)
Anion gap: 5 (ref 5–15)
BILIRUBIN TOTAL: 0.5 mg/dL (ref 0.3–1.2)
BUN: 7 mg/dL (ref 6–20)
CALCIUM: 8.3 mg/dL — AB (ref 8.9–10.3)
CHLORIDE: 107 mmol/L (ref 101–111)
CO2: 25 mmol/L (ref 22–32)
CREATININE: 0.56 mg/dL (ref 0.44–1.00)
GFR calc non Af Amer: >60 mL/min (ref >60)
Glucose, Bld: 99 mg/dL (ref 65–99)
Potassium: 3.8 mmol/L (ref 3.5–5.1)
Sodium: 137 mmol/L (ref 135–145)
TOTAL PROTEIN: 5.4 g/dL — AB (ref 6.5–8.1)

## 2015-11-30 LAB — URIC ACID: Uric Acid, Serum: 4.3 mg/dL (ref 2.3–6.6)

## 2015-11-30 LAB — LACTATE DEHYDROGENASE: LDH: 151 U/L (ref 98–192)

## 2015-11-30 MED ORDER — DIBUCAINE 1 % RE OINT: 1.0000 "application " | TOPICAL_OINTMENT | RECTAL | Status: AC | PRN

## 2015-11-30 MED ORDER — DOCUSATE SODIUM 100 MG PO CAPS: 100.0000 mg | ORAL_CAPSULE | Freq: Two times a day (BID) | ORAL | Status: AC

## 2015-11-30 MED ORDER — WITCH HAZEL-GLYCERIN EX PADS: 1.0000 "application " | MEDICATED_PAD | CUTANEOUS | Status: AC | PRN

## 2015-11-30 MED ORDER — POLYSACCHARIDE IRON COMPLEX 150 MG PO CAPS: 150.0000 mg | ORAL_CAPSULE | Freq: Every day | ORAL | Status: AC

## 2015-11-30 MED ORDER — IBUPROFEN 600 MG PO TABS: 600.0000 mg | ORAL_TABLET | Freq: Four times a day (QID) | ORAL | Status: AC

## 2015-11-30 MED ORDER — OXYCODONE-ACETAMINOPHEN 5-325 MG PO TABS: 2.0000 | ORAL_TABLET | ORAL | Status: AC | PRN

## 2015-11-30 NOTE — Op Note (Signed)
NAMESETAREH, LOIACONO            ACCOUNT NO.:  [id number]  MEDICAL RECORD NO.:  [id number]  LOCATION:  [id number]                          FACILITY:  [facility]  PHYSICIAN:  Jola Schmidt, MD   DATE OF BIRTH:  1971/02/07  DATE OF PROCEDURE:  11/28/2015 DATE OF DISCHARGE:                              OPERATIVE REPORT   PREOPERATIVE DIAGNOSES: 1. Status post low forceps assisted vaginal delivery. 2. Status post midline episiotomy. 3. Bilateral vaginal sulcus tears.  POSTOPERATIVE DIAGNOSES: 1. Bilateral vaginal sulcus tears. 2. Third-degree lacerations.  PROCEDURES:  Exam under anesthesia, suture repair of laceration of the vagina and perineum.  PRIMARY SURGEON:  Jola Schmidt, M.D.  ASSISTANT SURGEONS:  Dr. Janyth Pupa and Dr. Aletha Halim.  ASSISTANT:  OR Merchant navy officer.  ANESTHESIA:  Epidural.  ESTIMATED BLOOD LOSS:  550.  BLOOD ADMINISTERED:  None.  DRAINS:  Foley and vaginal packing placed at the end of procedure.  LOCAL:  None.  SPECIMEN:  None.  DISPOSITION OF SPECIMEN:  To Pathology.  COUNTS:  Correct x 3.  PATIENT DISPOSITION:  To PACU, hemodynamically stable.  FINDINGS:  Deep and high bilateral vaginal lacerations.  The right extended up to the vaginal fornix.  Third-degree laceration noted. Bladder normal, no lacerations, but gross hematuria noted prior to procedure, but was resolving during the case.  DESCRIPTION OF PROCEDURE:  Ms. Mariah Marks had a forceps vaginal delivery with deep vaginal sulcus tears that I could not visualize in the birthing suite.  The patient was brought back to the operating room to improve optimal visualization, so that they could be repaired appropriately.  Please see my delivery note for the details.  The patient was moved emergently to the operating room to further repair.  Dr. Ilda Basset joined me at the start of the case while my partner, Dr. Nelda Marseille, was en route.  I have prepped the abdomen with DuraPrep, and he examined the  vagina, so that he could see the tears and then he prepped the vagina.  A time-out was performed.  SCDs were on and operated.  The patient was placed in dorsal lithotomy position and prepped and draped in normal sterile fashion.  Exam under anesthesia was performed.  I had originally, on the right-hand side, repaired the right vaginal sulcus tear and the deepest layer, but those were not well visualized, but it was hemostatic.  I began on the left-hand side to help with hemostasis after the vaginal packing was removed and it was closed with 0 Vicryl in several layers.  We did have difficulty visualizing the apex. Wide Deaver and the [employer] retractors were then used.  I was able to visualize and close the vaginal laceration that extended to the upper 1/3rd of the vagina with 0 Vicryl in a running locked fashion.  When I got to the middle 1/3rd of the vagina, I noticed that it was deeper than at the top.  I then closed the deeper layer _on the left with 0 Vicryl apex to the introitus.  Hemostasis was noted.  I then closed the superficial layer of the vaginal mucosa with 2-0 Vicryl in a continuous locked fashion.  I then turned attention back to the right hand vaginal  sulcus tear.  We then were able to get a good look at the apex which was in the upper 1/3rd of the vagina to the anterior fornix.  We needed the weighted speculum, a wide retractor, and Breisky used to optimize the visualization.  Adipose tissue was seen on both sides in the pelvic floor, but I did not visualize any significant structures or vessels at the apex.  I stayed superficial to reapproximate and for hemostasis.  I closed that with 0 Vicryl in a continuous running fashion.  I closed the deeper layers with 0 Vicryl as well and then the top layers with 2-0 Vicryl in a continuous locked fashion.  Attention was then turned to the perineum.  The episiotomy repair was evaluated.  The anal sphincter was then identified.   The sphincter muscle was then grasped with Allis clamps and 0 Vicryl was used to reapproximate that with 3 interrupted sutures.  I then closed the second- degree repair with 0 Vicryl and then reapproximated the mucosa and skin with 3-0 Vicryl on an SH needle.  The tear extended down to the anus and over the hemorrhoids.  I stayed very superficial and did not repair that extensively because it was well approximated.  Digital rectal exam was performed before and after.  The mucosa was intact.  The sphincter was good afterwards.  The vagina was explored.  The vaginal sulcus tears were not actively bleeding.  We changed out several laparotomy sponges because we needed to displace the cervix, push it out of the way, so that we could see the apex of the vaginal lacerations.  So that, packing was removed and evaluated for any bleeding from the uterus.  The fundus was massaged. Of note, the patient had approximately 14 cm fundal fibroid and when I was able to palpate the fundus, there was some trickling noted.  I then evaluated the cervix and the uterus for clots.  I was unable to feel any clots.  I then did a manual fundal massage.  She had some trickling.  I intended to pack the vagina for a few hours for tamponade for the vaginal lacerations.  So, I decided to place Cytotec 800 mcg per rectum to minimize any uterine bleeding.  Vaginal packing was placed.  Moistened Kerlix roll was not packed tightly, but enough to apply gentle pressure.  The patient was then cleaned and transferred to the PACU in stable condition.  Prior to the procedure, it was noted the patient had gross hematuria in the birthing suite.  We asked several times during the case and states the urine was clearing and it was slowly.  The bladder was extensively evaluated and there were no signs of any lacerations to the vagina.  Instrument, sponge, and needle counts were correct x3.  The patient was taken to recovery room  in stable condition.     Jola Schmidt, MD     EBV/MEDQ  D:  11/29/2015  T:  11/30/2015  Job:  [id number]

## 2015-11-30 NOTE — Lactation Note (Signed)
This note was copied from a baby's chart. Lactation Consultation Note  Patient Name: Mariah Marks [id number] Date: 11/30/2015 Reason for consult: Follow-up assessment   Follow up with mom of 84 hour old infant. Infant with 9 BF for 20-50 minutes, 4 voids and 3 stools in 24 hours preceding this assessment. Infant weight 7 lb 13.6 oz with weight loss of 1% since birth, weight down 5.4 oz since yesterday. (weight may not be accurate as affected by dressing that infant has to hold broken arm in place)  Mom denies nipple pain or tenderness. Mom is concerned that infant is not getting enough, discussed supply and demand with mom. Infant was asleep on mom's chest and not showing feeding cues currently. Discussed infant with appropriate output for age.   Reviewed all BF information in Taking Care of Baby and Me Booklet. Engorgement prevention/treatment, pre pumping to soften areola and comfort pumping discussed with mom. I/O discussed and enc mom to keep feeding log and take to Ped appt. Infant does not have F/U appt scheduled yet per mom, mom is aware infant needs to be seen within 1-2 days of d/c. Discussed awakening techniques to be used prn with feeding.  Mom has PIS backpack DEBP for home that she got from a friend. She has new tubing and was shown how to use pump per her request.  [place] reviewed, mom aware of OP services, [place] phone # and BF Support Groups. Enc her to call with questions/concerns prn.       Maternal Data Does the patient have breastfeeding experience prior to this delivery?: No  Feeding    LATCH Score/Interventions                      Lactation Tools Discussed/Used WIC Program: No Pump Review: Setup, frequency, and cleaning;Milk Storage   Consult Status Consult Status: Complete Follow-up type: Call as needed    Donn Pierini 11/30/2015, 9:45 AM

## 2015-11-30 NOTE — Discharge Summary (Signed)
Vaginal Delivery Discharge Summary  Mariah Marks  DOB:    [birth year] MRN:    [id number] CSN:    [id number]  Date of admission:                  11/25/2015  Date of discharge:                   11/30/2015  Procedures this admission:   1.  Induction of labor for chronic HTN.  2.Forceps assisted vaginal delivery for abnormal fetal heart tracing 3.  Repair of 3rd degree laceration and bilateral vaginal sulcus tears under anesthesia  Date of Delivery: 11/28/2015  Newborn Data:  Live born female  Name: Sherrilee Gilles Birth Weight: 7 lb 12.2 oz (3520 g) APGAR: 2, 9  Home with mother. Circumcision Plan: Outpatient circumcision.   History of Present Illness:  Mariah Marks is a 45 y.o. female, G2P1011, who presents at [redacted]w[redacted]d weeks gestation. The patient has been followed at [facility] and Gynecology division of [facility]. She was admitted for induction of labor for chronic hypertension. Her pregnancy has been complicated by:  Patient Active Problem List   Diagnosis Date Noted  . High vaginal laceration during delivery 11/29/2015  . Low forceps delivery, delivered, current hospitalization 11/29/2015  . Chronic hypertension in pregnancy 11/25/2015     Hospital Course:   Utilized epidural for pain management.  Delivery was performed by Dr. Lonia Mad and Dr. Simona Huh by forceps after prolonged deceleration.  Delivery was also complicated by shoulder dystocia and fracture of baby's right humerus.  Repairs of tears were done by Dr. Simona Huh and Dr. Nelda Marseille in [place].  Infant status improved quickly after delivery and remained in room with mother, the right arm was stabilized.  Mother and infant then had an uncomplicated postpartum course, with breastfeeding going well and mother ambulating and voiding without difficulty.   Mom's physical exam was WNL, and she was discharged home in stable condition. Contraception plan was undecided.  Her blood  pressures were mostly normal in postpartum period, some were high but not in the severe range or a range that necessitated oral antihypertensives.  Patient reported some episodes of urine incontinence when with a full bladder, discussed Kegels and will follow up in postpartum period.  Plan for baby was to follow up with orthopedics for further management of humerus fracture.    Intrapartum Procedures:  AROM, misoprostol, pitocin use, Forceps vaginal delivery, Episiotomy.   Postpartum Procedures: Laceration repairs Complications-Operative and Postpartum: Fracture of baby's left humerus, Postpartum hemorrhage, anemia  Discharge Diagnoses: Term Pregnancy-delivered and Chronic hypertension Anemia  Feeding:  breast  Contraception:  Undecided  Hemoglobin Results:  CBC Latest Ref Rng 11/30/2015 11/29/2015 11/28/2015  WBC 4.0 - 10.5 K/uL 10.2 15.8(H) 23.2(H)  Hemoglobin 12.0 - 15.0 g/dL 7.6(L) 7.7(L) 9.2(L)  Hematocrit 36.0 - 46.0 % 21.2(L) 21.4(L) 26.4(L)  Platelets 150 - 400 K/uL 152 139(L) 176    Discharge Physical Exam:   General: alert, cooperative and no distress Lochia: appropriate Uterine Fundus: firm DVT Evaluation: No evidence of DVT seen on physical exam. Calf/Ankle edema is present.   Discharge Information:  Activity:           pelvic rest Diet:                routine and Low sodium diet Medications: Ibuprofen, Colace, Iron and Percocet, Hemorrhoid ointment, tucks Condition:      stable Instructions:  Routine pp instructions,  Breastfeeding, chronic hypertension.   Discharge to: home  Follow-up Information    Follow up with Catha Brow., MD In 1 week.   Specialty:  Obstetrics and Gynecology   Why:  Postpartum follow up.    Contact information:   [address] [place] [address] [phone]        Alinda Dooms MD 11/30/2015 11:25 AM

## 2015-12-01 ENCOUNTER — Encounter (HOSPITAL_COMMUNITY): Payer: Self-pay | Admitting: Obstetrics and Gynecology

## 2015-12-03 ENCOUNTER — Encounter (HOSPITAL_COMMUNITY): Payer: Self-pay

## 2015-12-03 ENCOUNTER — Inpatient Hospital Stay (HOSPITAL_COMMUNITY)
Admission: AD | Admit: 2015-12-03 | Discharge: 2015-12-05 | DRG: 776 | Disposition: A | Discharge status: Home / Self Care | Payer: BC Managed Care – PPO | Source: Ambulatory Visit | Attending: Obstetrics and Gynecology | Admitting: Obstetrics and Gynecology

## 2015-12-03 ENCOUNTER — Other Ambulatory Visit (HOSPITAL_COMMUNITY): Payer: Self-pay | Admitting: Obstetrics and Gynecology

## 2015-12-03 DIAGNOSIS — Z825 Family history of asthma and other chronic lower respiratory diseases: Secondary | ICD-10-CM

## 2015-12-03 DIAGNOSIS — K5900 Constipation, unspecified: Secondary | ICD-10-CM | POA: Diagnosis present

## 2015-12-03 DIAGNOSIS — O860 Infection of obstetric surgical wound: Secondary | ICD-10-CM | POA: Diagnosis present

## 2015-12-03 DIAGNOSIS — O115 Pre-existing hypertension with pre-eclampsia, complicating the puerperium: Principal | ICD-10-CM | POA: Diagnosis present

## 2015-12-03 DIAGNOSIS — O872 Hemorrhoids in the puerperium: Secondary | ICD-10-CM | POA: Diagnosis present

## 2015-12-03 DIAGNOSIS — O10919 Unspecified pre-existing hypertension complicating pregnancy, unspecified trimester: Secondary | ICD-10-CM

## 2015-12-03 DIAGNOSIS — Z833 Family history of diabetes mellitus: Secondary | ICD-10-CM

## 2015-12-03 DIAGNOSIS — O1495 Unspecified pre-eclampsia, complicating the puerperium: Secondary | ICD-10-CM | POA: Diagnosis present

## 2015-12-03 LAB — COMPREHENSIVE METABOLIC PANEL
ALT: 36 U/L (ref 14–54)
ANION GAP: 7 (ref 5–15)
AST: 23 U/L (ref 15–41)
Albumin: 2.8 g/dL — ABNORMAL LOW (ref 3.5–5.0)
Alkaline Phosphatase: 75 U/L (ref 38–126)
BUN: 9 mg/dL (ref 6–20)
CHLORIDE: 107 mmol/L (ref 101–111)
CO2: 26 mmol/L (ref 22–32)
CREATININE: 0.58 mg/dL (ref 0.44–1.00)
Calcium: 8.5 mg/dL — ABNORMAL LOW (ref 8.9–10.3)
Glucose, Bld: 102 mg/dL — ABNORMAL HIGH (ref 65–99)
POTASSIUM: 3.2 mmol/L — AB (ref 3.5–5.1)
SODIUM: 140 mmol/L (ref 135–145)
Total Bilirubin: 0.5 mg/dL (ref 0.3–1.2)
Total Protein: 6.1 g/dL — ABNORMAL LOW (ref 6.5–8.1)

## 2015-12-03 LAB — LACTATE DEHYDROGENASE: LDH: 179 U/L (ref 98–192)

## 2015-12-03 LAB — URIC ACID: Uric Acid, Serum: 4.2 mg/dL (ref 2.3–6.6)

## 2015-12-03 LAB — CBC
HCT: 21 % — ABNORMAL LOW (ref 36.0–46.0)
HEMOGLOBIN: 7.1 g/dL — AB (ref 12.0–15.0)
MCH: 28.7 pg (ref 26.0–34.0)
MCHC: 33.8 g/dL (ref 30.0–36.0)
MCV: 85 fL (ref 78.0–100.0)
PLATELETS: 244 10*3/uL (ref 150–400)
RBC: 2.47 MIL/uL — AB (ref 3.87–5.11)
RDW: 14 % (ref 11.5–15.5)
WBC: 6.4 10*3/uL (ref 4.0–10.5)

## 2015-12-03 MED ORDER — DOCUSATE SODIUM 100 MG PO CAPS
100.0000 mg | ORAL_CAPSULE | Freq: Two times a day (BID) | ORAL | Status: DC
  Administered 2015-12-03 – 2015-12-05 (×4): 100 mg via ORAL
  Filled 2015-12-03 (×4): qty 1

## 2015-12-03 MED ORDER — BENZOCAINE-MENTHOL 20-0.5 % EX AERO: 1.0000 "application " | INHALATION_SPRAY | Freq: Four times a day (QID) | CUTANEOUS | Status: DC | PRN

## 2015-12-03 MED ORDER — HYDRALAZINE HCL 20 MG/ML IJ SOLN
10.0000 mg | Freq: Once | INTRAMUSCULAR | Status: DC | PRN
Start: 2015-12-03 — End: 2015-12-05

## 2015-12-03 MED ORDER — MAGNESIUM SULFATE 50 % IJ SOLN
2.0000 g/h | INTRAMUSCULAR | Status: DC
  Administered 2015-12-03: 2 g/h via INTRAVENOUS
  Filled 2015-12-03 (×2): qty 80

## 2015-12-03 MED ORDER — POLYETHYLENE GLYCOL 3350 17 G PO PACK
17.0000 g | PACK | Freq: Every day | ORAL | Status: DC
  Administered 2015-12-03 – 2015-12-05 (×3): 17 g via ORAL
  Filled 2015-12-03 (×3): qty 1

## 2015-12-03 MED ORDER — IBUPROFEN 800 MG PO TABS
800.0000 mg | ORAL_TABLET | Freq: Three times a day (TID) | ORAL | Status: DC
  Administered 2015-12-03 – 2015-12-05 (×7): 800 mg via ORAL
  Filled 2015-12-03 (×7): qty 1

## 2015-12-03 MED ORDER — OXYCODONE-ACETAMINOPHEN 5-325 MG PO TABS
1.0000 | ORAL_TABLET | ORAL | Status: DC | PRN
  Administered 2015-12-04 (×2): 2 via ORAL
  Administered 2015-12-05: 1 via ORAL
  Administered 2015-12-05: 2 via ORAL
  Filled 2015-12-03 (×3): qty 2
  Filled 2015-12-03: qty 1
  Filled 2015-12-03: qty 2

## 2015-12-03 MED ORDER — CEPHALEXIN 500 MG PO CAPS
500.0000 mg | ORAL_CAPSULE | Freq: Two times a day (BID) | ORAL | Status: DC
  Administered 2015-12-03 – 2015-12-05 (×4): 500 mg via ORAL
  Filled 2015-12-03 (×6): qty 1

## 2015-12-03 MED ORDER — LABETALOL HCL 5 MG/ML IV SOLN: 20.0000 mg | INTRAVENOUS | Status: DC | PRN

## 2015-12-03 MED ORDER — MAGNESIUM SULFATE BOLUS VIA INFUSION
4.0000 g | Freq: Once | INTRAVENOUS | Status: AC
  Administered 2015-12-03: 4 g via INTRAVENOUS
  Filled 2015-12-03: qty 500

## 2015-12-03 MED ORDER — LACTATED RINGERS IV SOLN
INTRAVENOUS | Status: DC
  Administered 2015-12-03 – 2015-12-04 (×2): via INTRAVENOUS

## 2015-12-03 NOTE — H&P (Signed)
Mariah Marks is a 45 y.o. female G3P1021  At POD #5 s/p focep assisted vaginal delivery presents for management of superimposed postpartum preeclampsia. Her delivery was complicated by shoulder dystocia and bilateral vaginal sulcus lacerations. She was seen in the office yesterday by Dr. Nelda Marseille for evaluation of perineal pain. Her bp in the office was 160/90. PCR came back today at 1.8. Pt was asked to come in for inpatient management of postpartum preeclampsia. She reports bp at home today 155/100. She took a dosage of labetalol 200 mg last night and this morning. She admits that the medication that she took had expired. She reports a headache earlier however it has expired. No visual disturbances or ruq pain. She rates her perineal pain as a 2 out of 10 currently. It has improved since yesterday after taking the percocet. It is worse when she ambulates.  She has constipation. She has not had a bowel movement since delivery. She has been taking colace and started miralax last night.  History OB History    Gravida Para Term Preterm AB TAB SAB Ectopic Multiple Living   2 1 1  1  1   0 1     Past Medical History  Diagnosis Date  . Hypertension   . Anemia   . Fibroid    Past Surgical History  Procedure Laterality Date  . Throat surgery    . Dilation and evacuation N/A 05/22/2014    Procedure: DILATATION AND EVACUATION;  Surgeon: Ena Dawley, MD;  Location: [facility];  Service: Gynecology;  Laterality: N/A;  . Perineal laceration repair N/A 11/28/2015    Procedure: SUTURE REPAIR PERINEAL LACERATION;  Surgeon: Thurnell Lose, MD;  Location: [facility];  Service: Gynecology;  Laterality: N/A;   Family History: family history includes Asthma in her brother; Diabetes in her brother. Social History:  reports that she has never smoked. She has never used smokeless tobacco. She reports that she does not drink alcohol or use illicit drugs.    Review of Systems  Constitutional: Negative.   HENT:  Negative.   Eyes: Negative.   Respiratory: Negative.   Cardiovascular: Negative.   Gastrointestinal: Positive for constipation.  Genitourinary: Negative.   Musculoskeletal: Positive for myalgias.       Left arm pain .Marland Kitchen Right upper thigh pain   Skin: Negative.   Neurological: Negative.   Endo/Heme/Allergies: Negative.   Psychiatric/Behavioral: Negative.       Blood pressure 158/65, pulse 107, temperature 98.7 F (37.1 C), temperature source Oral, resp. rate 18, height 5\' 3"  (1.6 m), weight 238 lb 12 oz (108.296 kg), last menstrual period 02/20/2015, SpO2 100 %, unknown if currently breastfeeding. Exam Physical Exam  Constitutional: She is oriented to person, place, and time. She appears well-developed and well-nourished.  HENT:  Head: Normocephalic and atraumatic.  Eyes: Conjunctivae are normal. Pupils are equal, round, and reactive to light.  Neck: Normal range of motion. Neck supple.  Cardiovascular: Normal rate and regular rhythm.   Respiratory: Breath sounds normal. No respiratory distress. She has no wheezes. She has no rales.  GI: Soft. Bowel sounds are normal. She exhibits no distension. There is no tenderness. There is no rebound and no guarding.  Musculoskeletal: Normal range of motion. She exhibits edema.  2+ pedal edema   Neurological: She is alert and oriented to person, place, and time. She has normal reflexes.  Skin: Skin is warm and dry.  Psychiatric: She has a normal mood and affect.    Prenatal labs: ABO, Rh: --/--/O  POS, O POS (05/30 2048) Antibody: NEG (05/30 2048) Rubella: Nonimmune (10/19 0000) RPR: Non Reactive (05/30 2048)  HBsAg: Negative (10/19 0000)  HIV: Non-reactive (10/19 0000)  GBS: Positive (05/08 0000)   Assessment/Plan: 1)Postpartum preeclampsia. - start Magnesium sulfate for seizure prophylaxis. Start at 4 grams bolus followed by 2 grams an hour.  IV labetalol and Hydralazine protocol ordered for BP greater than 160/110 Will monitor bp if  serial bp greater than 150/90 start labetalol 200 mg bid.  2) Perineal infection - continue kelfex 500 mg bid  3) perineal pain- Motrin scheduled 800 mg tid .Marland Kitchen Percocet prn  4) constipation Colace 100 mg bid.. Miralax daily  Dr. Simona Huh covering until 7 pm  Carrol Hougland J. 12/03/2015, 5:24 PM

## 2015-12-04 DIAGNOSIS — O872 Hemorrhoids in the puerperium: Secondary | ICD-10-CM | POA: Diagnosis present

## 2015-12-04 DIAGNOSIS — O860 Infection of obstetric surgical wound: Secondary | ICD-10-CM | POA: Diagnosis present

## 2015-12-04 DIAGNOSIS — Z825 Family history of asthma and other chronic lower respiratory diseases: Secondary | ICD-10-CM | POA: Diagnosis not present

## 2015-12-04 DIAGNOSIS — O115 Pre-existing hypertension with pre-eclampsia, complicating the puerperium: Secondary | ICD-10-CM | POA: Diagnosis present

## 2015-12-04 DIAGNOSIS — Z833 Family history of diabetes mellitus: Secondary | ICD-10-CM | POA: Diagnosis not present

## 2015-12-04 DIAGNOSIS — K5900 Constipation, unspecified: Secondary | ICD-10-CM | POA: Diagnosis present

## 2015-12-04 LAB — CBC
HEMATOCRIT: 22.4 % — AB (ref 36.0–46.0)
HEMOGLOBIN: 7.5 g/dL — AB (ref 12.0–15.0)
MCH: 28.5 pg (ref 26.0–34.0)
MCHC: 33.5 g/dL (ref 30.0–36.0)
MCV: 85.2 fL (ref 78.0–100.0)
Platelets: 273 10*3/uL (ref 150–400)
RBC: 2.63 MIL/uL — AB (ref 3.87–5.11)
RDW: 14.3 % (ref 11.5–15.5)
WBC: 6.5 10*3/uL (ref 4.0–10.5)

## 2015-12-04 LAB — COMPREHENSIVE METABOLIC PANEL
ALT: 36 U/L (ref 14–54)
AST: 23 U/L (ref 15–41)
Albumin: 3 g/dL — ABNORMAL LOW (ref 3.5–5.0)
Alkaline Phosphatase: 77 U/L (ref 38–126)
Anion gap: 7 (ref 5–15)
BUN: 8 mg/dL (ref 6–20)
CHLORIDE: 105 mmol/L (ref 101–111)
CO2: 27 mmol/L (ref 22–32)
Calcium: 7.7 mg/dL — ABNORMAL LOW (ref 8.9–10.3)
Creatinine, Ser: 0.53 mg/dL (ref 0.44–1.00)
Glucose, Bld: 107 mg/dL — ABNORMAL HIGH (ref 65–99)
POTASSIUM: 3.4 mmol/L — AB (ref 3.5–5.1)
Sodium: 139 mmol/L (ref 135–145)
Total Bilirubin: 0.5 mg/dL (ref 0.3–1.2)
Total Protein: 6.4 g/dL — ABNORMAL LOW (ref 6.5–8.1)

## 2015-12-04 LAB — MAGNESIUM: MAGNESIUM: 3.8 mg/dL — AB (ref 1.7–2.4)

## 2015-12-04 LAB — LACTATE DEHYDROGENASE: LDH: 193 U/L — AB (ref 98–192)

## 2015-12-04 MED ORDER — BISACODYL 5 MG PO TBEC
5.0000 mg | DELAYED_RELEASE_TABLET | Freq: Every day | ORAL | Status: DC | PRN
  Administered 2015-12-04: 5 mg via ORAL
  Filled 2015-12-04 (×2): qty 1

## 2015-12-04 MED ORDER — NIFEDIPINE ER 30 MG PO TB24
30.0000 mg | ORAL_TABLET | Freq: Every day | ORAL | Status: DC
  Administered 2015-12-04 – 2015-12-05 (×2): 30 mg via ORAL
  Filled 2015-12-04 (×3): qty 1

## 2015-12-04 NOTE — Progress Notes (Signed)
Post Partum Day 6 Subjective: Pt denies headaches or visual changes. Denies RUQ pain.  States she has not had a BM since discharged, ~ 5 days.  Requests enema.  Pt is concerned about hemorrhoids but denies significant pain.  Pt has a cream at bedside that she has not tried yet.  Denies significant pelvic or perineal pain.  Objective: Blood pressure 150/80, pulse 86, temperature 97.5 F (36.4 C), temperature source Oral, resp. rate 20, height 5\' 3"  (1.6 m), weight 106.028 kg (233 lb 12 oz), last menstrual period 02/20/2015, SpO2 99 %, unknown if currently breastfeeding.  Physical Exam:  General: alert, cooperative and no distress,  Pt sitting on bedside couch with knees to abdomen.  Appears very comfortable. Lungs:  CTAbilaterally, CVR: RRR Lochia: Not assessed Uterine Fundus: Not palpable. Incision: Not assessed. DVT Evaluation: No evidence of DVT seen on physical exam. Calf/Ankle edema is present. Neuro:  DTR 2+   Recent Labs  12/03/15 1720 12/04/15 0520  HGB 7.1* 7.5*  HCT 21.0* 22.4*    Assessment/Plan: Postpartum severe preeclampsia.  On Magnesium sulfate. No signs of severe Preeclampsia.  Adequate diuresis.  No s/sxs of Magnesium toxicity. H/o CHTN-BPs are not consistently 150/90 but suspect once Magnesium is stopped BP may trend up. Pelvic lacerations, 3rd degree laceration-  Doing well.  Discontinue Magnesium at 24 hours, 1800. Continue observation until am to monitor BP.  Start Procardia XL 30 mg if BP greater than 150/90. Dulcolax tablet for constipation.  Continue Colace and Miralax. Dr. Landry Mellow to assess pt tomorrow am for discharge.   LOS: 1 day   Mariah Marks 12/04/2015, 1:53 PM

## 2015-12-04 NOTE — Lactation Note (Signed)
Lactation Consultation Note  Patient Name: Mariah Marks [id number] Date: 12/04/2015   Consult with mom readmitted for increased BP. Mom is BF her son, who is with her in the room. She reports she has been fully BF until last evening when she began giving formula as she felt infant has been sleepy while she is taking Percocet. Informed mom that percocet is considered safe for BF mothers. Discussed supply and demand with mom and enc her to regularly empty breasts via infant or pump. Mom reports she is able to pump 1-2 oz/pumping. She reports infant generally BF every 2 hours. He is taking 2-3 oz of formula about every 3 hours. She is not using pumped EBM. Suggested she mix her BM with the formula. Mom has her PIS at bedside to pump. Mom to call with questrions/concerns prn.       Maternal Data    Feeding    LATCH Score/Interventions                      Lactation Tools Discussed/Used     Consult Status      Donn Pierini 12/04/2015, 10:34 AM

## 2015-12-04 NOTE — Progress Notes (Signed)
Magnesium sulfate d/c'd per provider order. IV saline locked. Patient ambulating and voiding without difficulty. Toya Smothers, RN

## 2015-12-05 MED ORDER — FLEET ENEMA 7-19 GM/118ML RE ENEM
1.0000 | ENEMA | Freq: Once | RECTAL | Status: AC
  Administered 2015-12-05: 1 via RECTAL

## 2015-12-05 MED ORDER — HYDROCORTISONE ACE-PRAMOXINE 1-1 % RE FOAM
1.0000 | Freq: Two times a day (BID) | RECTAL | Status: DC
  Administered 2015-12-05: 1 via RECTAL
  Filled 2015-12-05: qty 10

## 2015-12-05 MED ORDER — HYDROCORTISONE ACE-PRAMOXINE 1-1 % RE FOAM: 1.0000 | Freq: Two times a day (BID) | RECTAL | Status: AC

## 2015-12-05 MED ORDER — CEPHALEXIN 500 MG PO CAPS: 500.0000 mg | ORAL_CAPSULE | Freq: Two times a day (BID) | ORAL | Status: AC

## 2015-12-05 MED ORDER — NIFEDIPINE ER 30 MG PO TB24
30.0000 mg | ORAL_TABLET | Freq: Every day | ORAL | Status: AC
Start: 2015-12-05 — End: ?

## 2015-12-05 MED ORDER — HYDROCORTISONE 2.5 % RE CREA
TOPICAL_CREAM | Freq: Two times a day (BID) | RECTAL | Status: DC
  Filled 2015-12-05: qty 28.35

## 2015-12-05 MED ORDER — BISACODYL 10 MG RE SUPP
10.0000 mg | Freq: Every day | RECTAL | Status: DC | PRN
  Administered 2015-12-05: 10 mg via RECTAL
  Filled 2015-12-05: qty 1

## 2015-12-05 NOTE — Progress Notes (Signed)
Pt discharged to home with her aunt.  Condition stable.  Pt ambulated to car with A. Lysbeth Galas, [place].  No equipment for home ordered at discharge.

## 2015-12-05 NOTE — Progress Notes (Signed)
Post Partum Day 7 s/p  forcep assisted vaginal delivery  Readmitted 12/03/2015 with superimposed severe preeclampsia based on severe range bp and PCR of 1.8  Subjective: + flatus and  however she has not had a bowel movement. she denies headache visual disturbances or ruq pain. her perineum feels better she is wallking better   Objective: Blood pressure 142/82, pulse 102, temperature 98.6 F (37 C), temperature source Oral, resp. rate 18, height 5\' 3"  (1.6 m), weight 228 lb (103.42 kg), last menstrual period 02/20/2015, SpO2 100 %, unknown if currently breastfeeding.  Physical Exam:  General: alert and cooperative  CV rrr Lung clear bilaterally  Lochia: appropriate Uterine Fundus: firm Incision: Perieneum is healing appropriately and intact ... Large rectal hemorrhoid noted  DVT Evaluation: No evidence of DVT seen on physical exam.   Recent Labs  12/03/15 1720 12/04/15 0520  HGB 7.1* 7.5*  HCT 21.0* 22.4*    Assessment/Plan: Postpartum preeclampsia and chronic hypertension.- pt to continus procardia XL 30 mg  Constipation. No results with dulcolax suppository or fleets enema.. Pt request discharge home.. She can try magnesium citrate at home.. Continue colace 100 mg daily  Pain control- motrin scheduled for 3 days  And percocet prn Follow up in the office in 1 week for bp check    LOS: 2 days   Abid Bolla J. 12/05/2015, 3:39 PM

## 2016-01-06 NOTE — Discharge Summary (Signed)
Physician Discharge Summary  Patient ID: Mariah Marks MRN: [id number] DOB/AGE: 44-Jul-1972 44 y.o.  Admit date: 12/03/2015 Discharge date :12/05/2015 Admission Diagnoses: 1) postpartum preeclampsia 2) chronic hypertension 3) Perineal infection 4) Constipation   Discharge Diagnoses: Same Active Problems:   Preeclampsia in postpartum period   Discharged Condition: stable  Hospital Course: Pt was admitted on 12/03/2015 with superimposed postpartum preeclampsia based on elevated BP 160/90 and PCR of 1.8. She received 24  Hours of magnesium sulfate for seizure prophylaxis. She received keflex for perineal infection. She was also treated for constipation . She received colace miralax and fleet enema.   Consults: None  Significant Diagnostic Studies: labs: PCR 1.8   Treatments: antibiotics: Kelex... Seizure prophylaxis : Magnesium sulfate    Disposition: 01-Home or Self Care  Discharge Instructions    Activity as tolerated    Complete by:  As directed      Call MD for:  persistant nausea and vomiting    Complete by:  As directed      Call MD for:  severe uncontrolled pain    Complete by:  As directed      Call MD for:  temperature >100.4    Complete by:  As directed      Diet - low sodium heart healthy    Complete by:  As directed      Sexual acrtivity    Complete by:  As directed   Avoid sexual activity            Medication List    STOP taking these medications        labetalol 200 MG tablet  Commonly known as:  NORMODYNE      TAKE these medications        cephALEXin 500 MG capsule  Commonly known as:  KEFLEX  Take 1 capsule (500 mg total) by mouth 2 (two) times daily.     dibucaine 1 % Oint  Commonly known as:  NUPERCAINAL  Place 1 application rectally as needed for hemorrhoids.     docusate sodium 100 MG capsule  Commonly known as:  COLACE  Take 1 capsule (100 mg total) by mouth 2 (two) times daily.     hydrocortisone-pramoxine rectal foam  Commonly known  as:  PROCTOFOAM-HC  Place 1 applicator rectally 2 (two) times daily.     ibuprofen 600 MG tablet  Commonly known as:  ADVIL,MOTRIN  Take 1 tablet (600 mg total) by mouth every 6 (six) hours.     iron polysaccharides 150 MG capsule  Commonly known as:  NIFEREX  Take 1 capsule (150 mg total) by mouth daily.     NIFEdipine 30 MG 24 hr tablet  Commonly known as:  PROCARDIA-XL/ADALAT CC  Take 1 tablet (30 mg total) by mouth daily.     oxyCODONE-acetaminophen 5-325 MG tablet  Commonly known as:  PERCOCET/ROXICET  Take 2 tablets by mouth every 4 (four) hours as needed (pain scale > 7).     prenatal multivitamin Tabs tablet  Take 1 tablet by mouth daily at 12 noon.     witch hazel-glycerin pad  Commonly known as:  TUCKS  Apply 1 application topically as needed for hemorrhoids.           Follow-up Information    Follow up with Catha Brow., MD. Schedule an appointment as soon as possible for a visit in 1 week.   Specialty:  Obstetrics and Gynecology   Why:  postpartum visit    Contact information:  [address] [place] [address] [phone]       Signed: Catha Brow. 01/06/2016, 10:08 PM

## 2017-03-31 ENCOUNTER — Other Ambulatory Visit: Payer: Self-pay | Admitting: Family Medicine

## 2017-03-31 DIAGNOSIS — R102 Pelvic and perineal pain: Secondary | ICD-10-CM

## 2017-04-01 ENCOUNTER — Ambulatory Visit
Admission: RE | Admit: 2017-04-01 | Discharge: 2017-04-01 | Disposition: A | Discharge status: Home / Self Care | Payer: BC Managed Care – PPO | Source: Ambulatory Visit | Attending: Family Medicine | Admitting: Family Medicine

## 2017-04-01 DIAGNOSIS — R102 Pelvic and perineal pain: Secondary | ICD-10-CM

## 2018-01-31 ENCOUNTER — Other Ambulatory Visit: Payer: Self-pay | Admitting: Family Medicine

## 2018-01-31 ENCOUNTER — Ambulatory Visit
Admission: RE | Admit: 2018-01-31 | Discharge: 2018-01-31 | Disposition: A | Discharge status: Home / Self Care | Payer: BC Managed Care – PPO | Source: Ambulatory Visit | Attending: Family Medicine | Admitting: Family Medicine

## 2018-01-31 ENCOUNTER — Encounter: Payer: Self-pay | Admitting: Radiology

## 2018-01-31 DIAGNOSIS — K37 Unspecified appendicitis: Secondary | ICD-10-CM

## 2018-01-31 MED ORDER — IOPAMIDOL (ISOVUE-300) INJECTION 61%
100.0000 mL | Freq: Once | INTRAVENOUS | Status: AC | PRN
  Administered 2018-01-31: 100 mL via INTRAVENOUS

## 2018-08-28 ENCOUNTER — Ambulatory Visit: Payer: BC Managed Care – PPO | Admitting: Podiatry

## 2018-08-28 ENCOUNTER — Encounter: Payer: Self-pay | Admitting: Podiatry

## 2018-08-28 ENCOUNTER — Ambulatory Visit (INDEPENDENT_AMBULATORY_CARE_PROVIDER_SITE_OTHER): Payer: BC Managed Care – PPO

## 2018-08-28 ENCOUNTER — Other Ambulatory Visit: Payer: Self-pay | Admitting: Podiatry

## 2018-08-28 VITALS — BP 138/87 | HR 80

## 2018-08-28 DIAGNOSIS — M7662 Achilles tendinitis, left leg: Principal | ICD-10-CM

## 2018-08-28 DIAGNOSIS — M7661 Achilles tendinitis, right leg: Secondary | ICD-10-CM

## 2018-08-28 DIAGNOSIS — M722 Plantar fascial fibromatosis: Secondary | ICD-10-CM

## 2018-08-28 MED ORDER — MELOXICAM 15 MG PO TABS: 15.0000 mg | ORAL_TABLET | Freq: Every day | ORAL | 1 refills | Status: AC

## 2018-08-28 MED ORDER — METHYLPREDNISOLONE 4 MG PO TBPK: ORAL_TABLET | ORAL | 0 refills | Status: AC

## 2018-08-29 ENCOUNTER — Telehealth: Payer: Self-pay | Admitting: Podiatry

## 2018-08-29 NOTE — Telephone Encounter (Signed)
Patient called and states that she was prescribed a Medrol Dosepak yesterday.  She started taking it she woke up this morning with a severe headache.  She is continued on the steroids but she is concerned that this is causing her blood pressure to be high.  She last took it this morning before lunch.  She states that she took Tylenol her headache has dissipated.  She has no other symptoms.  She checked her blood pressure and she reports it is a 133/113.  I told her to recheck her blood pressure and if it is not going down that she needs to go to the emergency room or if her headache worsens she is to go immediately to the emergency room. She verbalized understanding and has no other questions or concerns.   Informed her not to take the mobic either. Will send a message to Dr. Amalia Hailey to see if he wants to prescribe anything else.

## 2018-08-30 NOTE — Progress Notes (Signed)
   HPI: 48 year old female presenting today as a new patient with a chief complaint of throbbing pain to the plantar aspects of the bilateral heels that began about one month ago. She states the pain is worse in the morning or when she stands after sitting for long periods of time. She has not done anything for treatment. Patient is here for further evaluation and treatment.   Past Medical History:  Diagnosis Date  . Anemia   . Fibroid   . Hypertension       Physical Exam: General: The patient is alert and oriented x3 in no acute distress.  Dermatology: Skin is warm, dry and supple bilateral lower extremities. Negative for open lesions or macerations.  Vascular: Palpable pedal pulses bilaterally. No edema or erythema noted. Capillary refill within normal limits.  Neurological: Epicritic and protective threshold grossly intact bilaterally.   Musculoskeletal Exam: Pain on palpation noted to the posterior tubercle of the bilateral calcaneus at the insertion of the Achilles tendon consistent with retrocalcaneal bursitis. Range of motion within normal limits. Muscle strength 5/5 in all muscle groups bilateral lower extremities.  Radiographic Exam:  Posterior calcaneal spur noted to the respective calcaneus on lateral view. No fracture or dislocation noted. Normal osseous mineralization noted.     Assessment: 1. Insertional Achilles tendinitis bilateral  2. Retrocalcaneal bursitis   Plan of Care:  1. Patient was evaluated. Radiographs were reviewed today. 2. Prescription for Medrol Dose Pak provided to patient. 3. Prescription for Meloxicam provided to patient. 4. Recommended good shoe gear.  5. Recommended daily calf stretches.  6. Return to clinic in 4 weeks.   Goes by [employer] Works at [employer].    Edrick Kins, DPM [facility]  Dr. Edrick Kins, [address]                                        [place], [place] [address]                 Office [phone]  Fax [phone]

## 2018-09-01 ENCOUNTER — Telehealth: Payer: Self-pay | Admitting: *Deleted

## 2018-09-01 MED ORDER — NONFORMULARY OR COMPOUNDED ITEM: 2 refills | Status: AC

## 2018-09-01 NOTE — Telephone Encounter (Signed)
Left message for pt to call to discuss change of orders.

## 2018-09-01 NOTE — Telephone Encounter (Signed)
-----   Message from Edrick Kins, DPM sent at 09/01/2018  7:45 AM EST ----- Regarding: alternative meds I am going to send some topical achilles tendinitis cream from [facility]. I'll fax it over from the [place] office here. Please let her know she will get a call from [facility] and they should mail it to her.  Also, she can resume the Meloxicam. Her BP isn't alarming. Thanks, Dr. Amalia Hailey ----- Message ----- From: Andres Ege, RN Sent: 08/31/2018   8:48 AM EST To: Edrick Kins, DPM  Dr. Amalia Hailey please advise, I left a copy of this message on your cubby desk yesterday. Mariah Marks ----- Message ----- From: Trula Slade, DPM Sent: 08/29/2018   6:00 PM EST To: Andres Ege, RN, Edrick Kins, DPM  Dr. Amalia Hailey- Do you want to prescribe her anything else? She called Tuesday evening.   Matt  ----  Patient called and states that she was prescribed a Medrol Dosepak yesterday.  She started taking it and she woke up this morning with a severe headache.  She is continued on the steroids but she is concerned that this is causing her blood pressure to be high.  She last took it this morning before lunch.  She states that she took Tylenol her headache has dissipated.  She has no other symptoms.  She checked her blood pressure and she reports it is a 133/113.  I told her to recheck her blood pressure and if it is not going down that she needs to go to the emergency room or if her headache worsens she is to go immediately to the emergency room. She verbalized understanding and has no other questions or concerns.   Informed her not to take the mobic either. Will send a message to Dr. Amalia Hailey to see if he wants to prescribe anything else.

## 2018-09-25 ENCOUNTER — Encounter: Payer: BC Managed Care – PPO | Admitting: Podiatry

## 2018-11-09 ENCOUNTER — Encounter: Payer: Self-pay | Admitting: *Deleted

## 2018-11-09 ENCOUNTER — Telehealth: Payer: Self-pay | Admitting: *Deleted

## 2018-11-09 NOTE — Telephone Encounter (Signed)
I reviewed Mariah Marks's clinicals and Dr. Amalia Hailey had wanted Mariah Marks to make a follow up appt after the 08/2018 office visit, also Mariah Marks had increase in blood pressure with a severe headache. I called Mariah Marks and informed that the cream prescribed 03/06/'2020 was for pain and inflammation. Mariah Marks states it has not helped at all she feels she has wasted her money, and the only relief she received was after taking one of the pills that started with a "M". Mariah Marks asked if she would start the "M" pill again, she stated she is now on Blood Pressure medications and it is under control.  I told Mariah Marks I would inform Dr. Amalia Hailey and write Dr. Harlan Stains concerning Blood Pressure and antiinflammatory medication. I told Mariah Marks she needed to make an appt and transferred Mariah Marks to schedulers.

## 2018-11-09 NOTE — Telephone Encounter (Signed)
Automated system states Dr. Caren Griffins [facility] fax is [phone]. Faxed letter requesting advise concerning pt's B/P and antiinflammatory therapy.

## 2018-11-09 NOTE — Telephone Encounter (Signed)
Pt states the cream Dr. Amalia Hailey prescribed is not helping, and asked what is it suppose to do.

## 2018-11-10 NOTE — Telephone Encounter (Addendum)
Faxed letter to Dr. Dema Severin. I called pt and informed the letter has been faxed and Dr. Dema Severin has not been able to respond.

## 2018-11-10 NOTE — Telephone Encounter (Signed)
Pt is scheduled for follow up appt on 11/15/18 but wanted to also follow up and see if we have received any information back from her PCP.

## 2018-11-15 ENCOUNTER — Ambulatory Visit: Payer: BC Managed Care – PPO | Admitting: Podiatry

## 2018-11-15 ENCOUNTER — Other Ambulatory Visit: Payer: Self-pay

## 2018-11-15 ENCOUNTER — Encounter: Payer: Self-pay | Admitting: Podiatry

## 2018-11-15 VITALS — Temp 97.7°F

## 2018-11-15 DIAGNOSIS — M7661 Achilles tendinitis, right leg: Secondary | ICD-10-CM | POA: Diagnosis not present

## 2018-11-15 DIAGNOSIS — M7662 Achilles tendinitis, left leg: Secondary | ICD-10-CM | POA: Diagnosis not present

## 2018-11-20 NOTE — Progress Notes (Signed)
   HPI: 48 year old female presenting today for follow up evaluation of insertional Achilles tendinitis bilaterally. She states her pain has not changed. She states there is no medication or cream that has helped alleviate her symptoms. Standing for long periods of time increases her pain. Patient is here for further evaluation and treatment.   Past Medical History:  Diagnosis Date  . Anemia   . Fibroid   . Hypertension       Physical Exam: General: The patient is alert and oriented x3 in no acute distress.  Dermatology: Skin is warm, dry and supple bilateral lower extremities. Negative for open lesions or macerations.  Vascular: Palpable pedal pulses bilaterally. No edema or erythema noted. Capillary refill within normal limits.  Neurological: Epicritic and protective threshold grossly intact bilaterally.   Musculoskeletal Exam: Pain on palpation noted to the posterior tubercle of the bilateral calcaneus at the insertion of the Achilles tendon consistent with retrocalcaneal bursitis. Range of motion within normal limits. Muscle strength 5/5 in all muscle groups bilateral lower extremities.  Assessment: 1. Insertional Achilles tendinitis bilateral  2. Retrocalcaneal bursitis   Plan of Care:  1. Patient was evaluated.  2. Injection of 0.5 mLs of Celestone Soluspan injected into the retrocalcaneal bursa bilaterally. Care was taken to avoid direct injection into the tendon. 3. Begin taking Meloxicam daily.  4. Continue stretching exercises.  5. Discussed PT vs EPAT. We may pursue one of these options if there is no improvement.  6. Return to clinic in 4 weeks.   Goes by [employer] Works at [employer].    Edrick Kins, DPM [facility]  Dr. Edrick Kins, [address]                                        [place], [place] [address]                Office [phone]  Fax [phone]

## 2018-11-30 NOTE — Telephone Encounter (Signed)
Left message informing pt of Dr. Amalia Hailey orders after receiving medical clearance from Dr. Dema Severin.

## 2018-11-30 NOTE — Telephone Encounter (Signed)
Dr. Amalia Hailey ordered Meloxicam 15mg  #30 one tablet daily, pt needs an appt prior to future refills.

## 2018-12-13 ENCOUNTER — Ambulatory Visit: Payer: BC Managed Care – PPO | Admitting: Podiatry

## 2019-09-02 IMAGING — US US PELVIS COMPLETE TRANSABD/TRANSVAG
1 series · 13 of 25 positions shown · non-contrast
Comparison: None

CLINICAL DATA: Initial evaluation for acute severe pelvic pain.

EXAM:
TRANSABDOMINAL AND TRANSVAGINAL ULTRASOUND OF PELVIS
TECHNIQUE: Both transabdominal and transvaginal ultrasound examinations of the
pelvis were performed. Transabdominal technique was performed for
global imaging of the pelvis including uterus, ovaries, adnexal
regions, and pelvic cul-de-sac. It was necessary to proceed with
endovaginal exam following the transabdominal exam to visualize the
uterus and ovaries.

[Series 1: us pelvis complete transabd/transvag · 0.20mm/px · 13 of 101 slices shown]
[im 1/101]
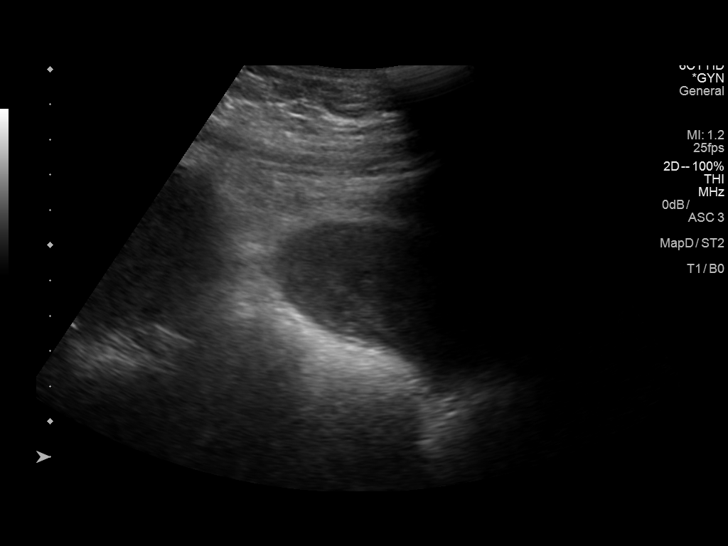
[im 9/101]
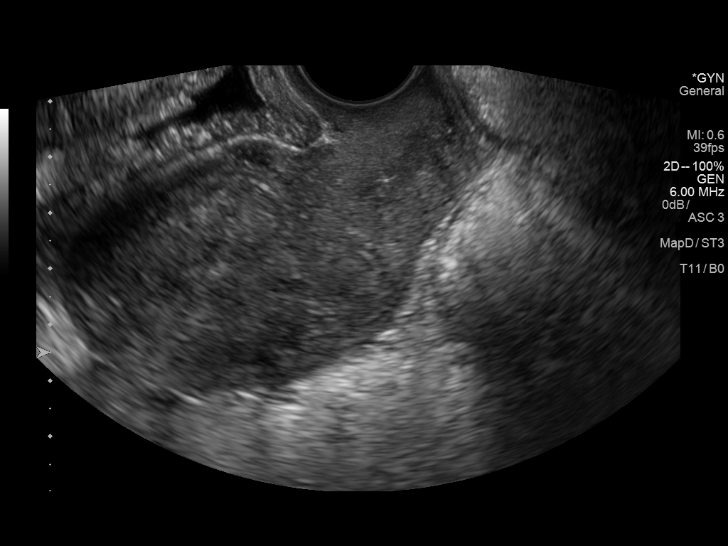
[im 17/101]
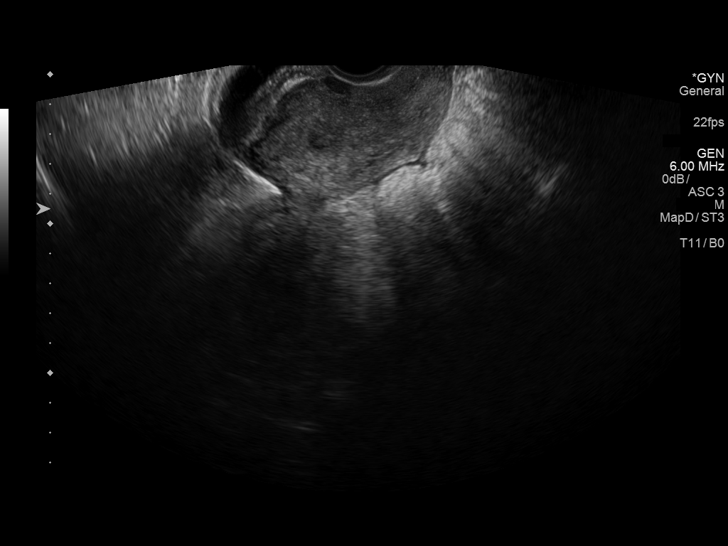
[im 26/101]
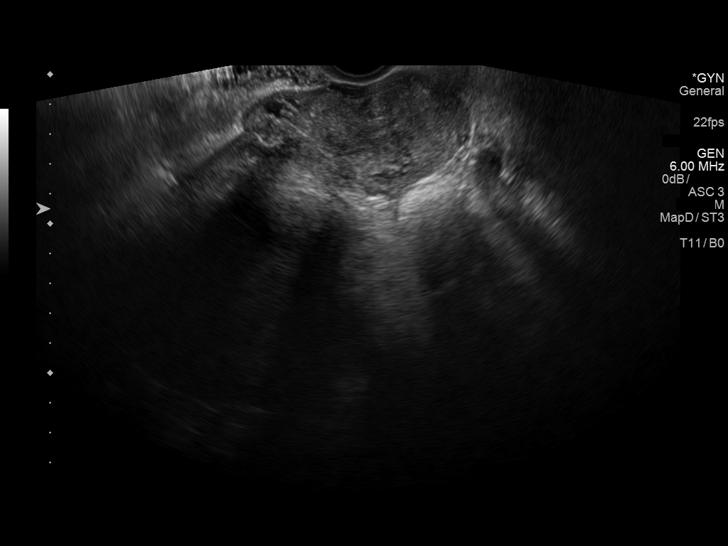
[im 34/101]
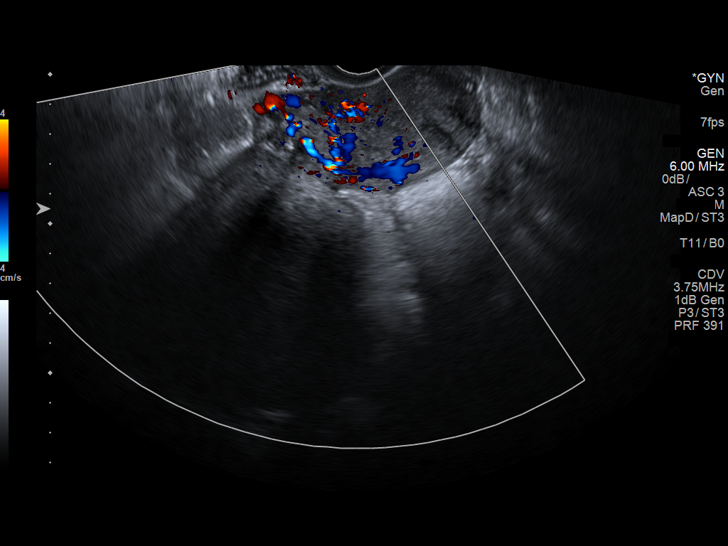
[im 42/101]
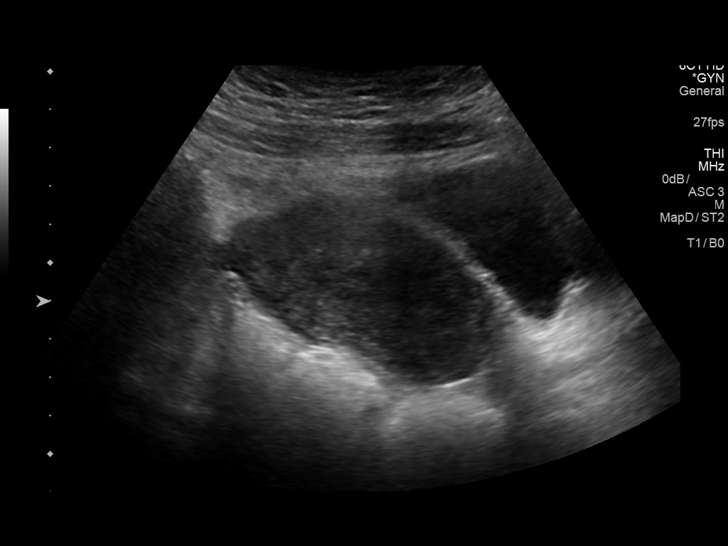
[im 51/101]
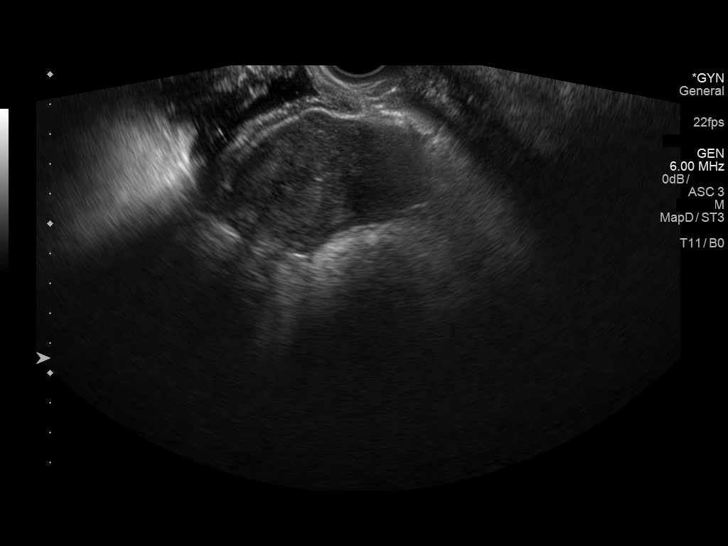
[im 59/101]
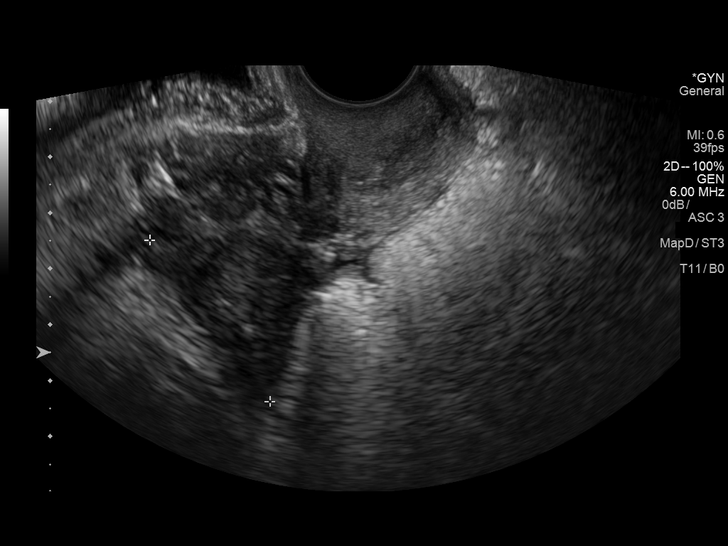
[im 67/101]
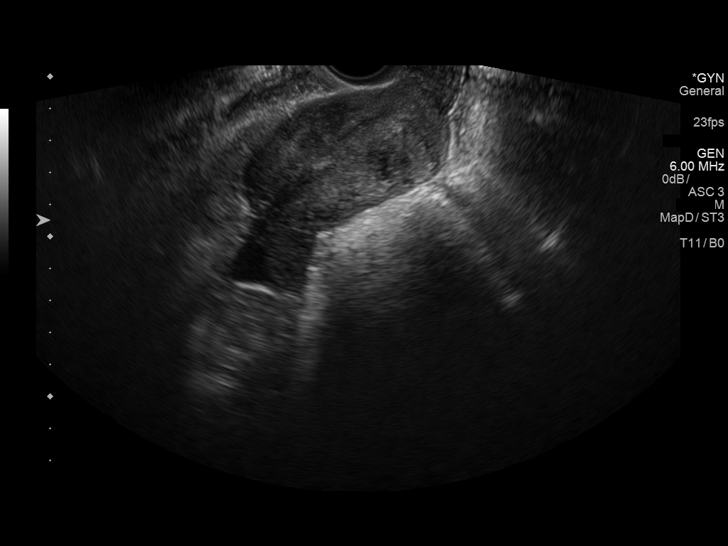
[im 76/101]
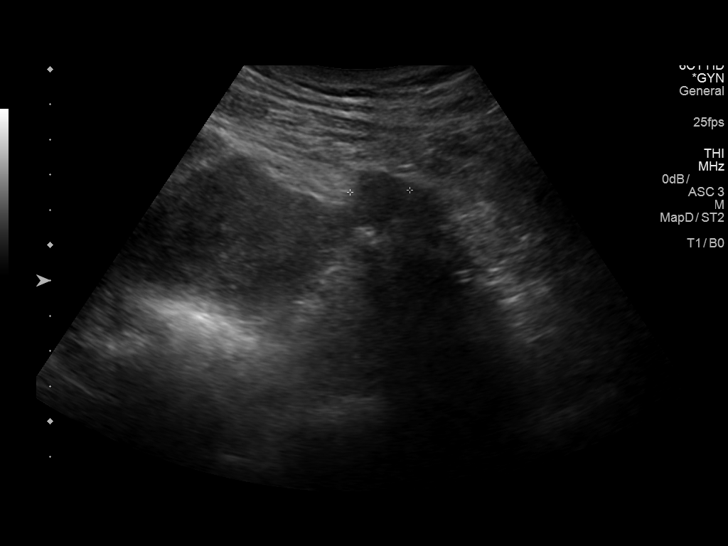
[im 84/101]
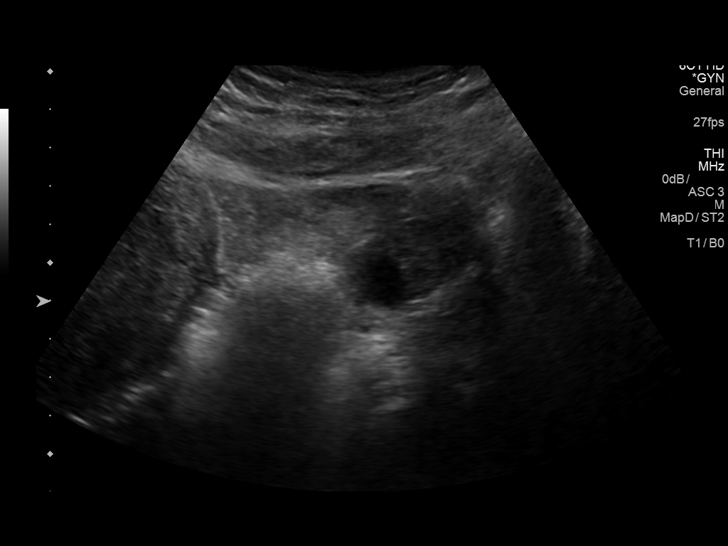
[im 92/101]
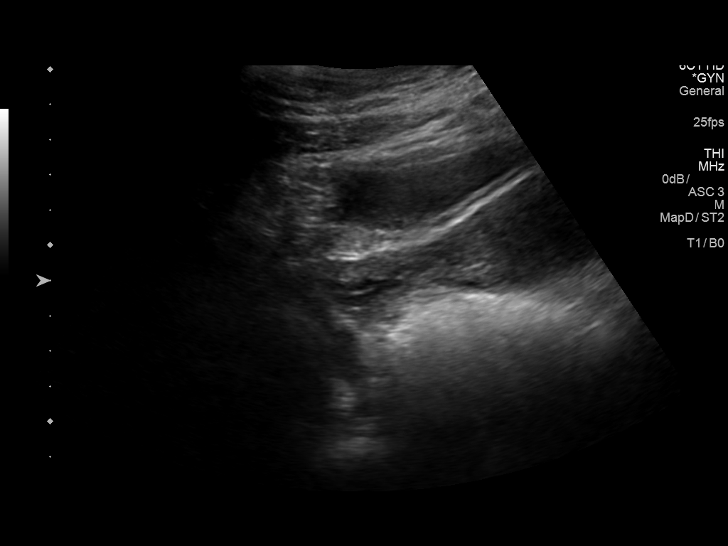
[im 101/101]
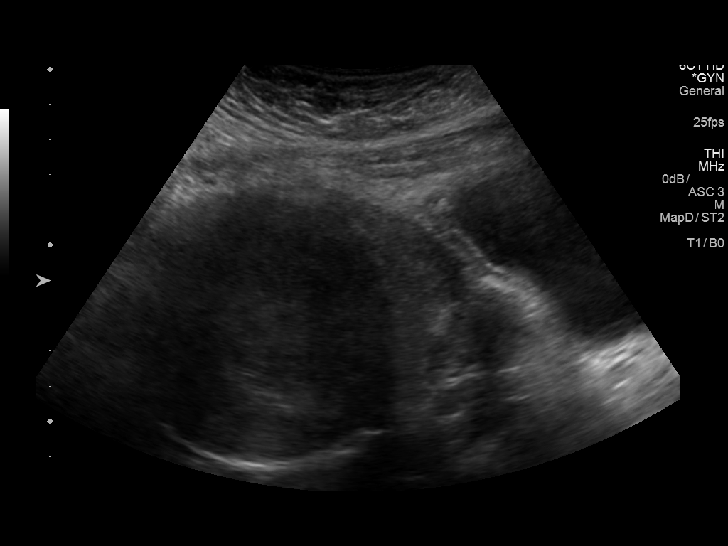

[13 of 25 positions shown; findings below may reference images not displayed]

FINDINGS: Uterus

Measurements: 9.7 x 4.2 x 5.9 cm. Myometrium is heterogeneous with
multiple fibroids present. Two predominantly intramural fibroids
positioned at the anterior and posterior lower uterine body measure
3.0 x 2.4 x 2.6 cm and 1.9 x 1.1 x 1.7 cm respectively. There is a
large hypoechoic fairly homogeneous mass lesion positioned within
the right adnexa measuring 11.2 x 8.9 x 9.9 cm. This lesion may be
contiguous with the adjacent uterus, and is suspicious for a
possible large pedunculated uterine fibroid.

Endometrium

Endometrium somewhat difficult to visualize due to overlying uterine
fibroids. Endometrial thickness measured approximately 8 mm. No
definite focal lesion or other abnormality identified.

Right ovary

Measurements: 3.6 x 2.3 x 2.3 cm. Normal appearance/no adnexal mass.

Left ovary

Measurements: 3.2 x 2.7 x 2.9 cm. Normal appearance/no adnexal mass.
2.3 x 1.6 x 1.7 cm simple anechoic cyst, most consistent with a
normal physiologic cyst.

Other findings

Trace free fluid within the right adnexa, likely physiologic.
IMPRESSION: 1. 11.2 x 8.9 x 9.9 cm right adnexal mass, favored to reflect a
large pedunculated uterine fibroid, although a separate right
adnexal mass is not entirely excluded. Follow-up examination with
dedicated pelvic MRI, with and without contrast, recommended for
further characterization.
2. Additional uterine fibroids as above.
3. Normal sonographic appearance of the ovaries. 2.3 cm left ovarian
cyst consistent with a normal physiologic cyst.
4. Small volume free fluid within the right adnexa.

## 2020-04-17 ENCOUNTER — Other Ambulatory Visit: Payer: Self-pay | Admitting: Family Medicine

## 2020-04-17 ENCOUNTER — Other Ambulatory Visit (HOSPITAL_COMMUNITY)
Admission: RE | Admit: 2020-04-17 | Discharge: 2020-04-17 | Disposition: A | Discharge status: Home / Self Care | Payer: BC Managed Care – PPO | Source: Ambulatory Visit | Attending: Family Medicine | Admitting: Family Medicine

## 2020-04-17 DIAGNOSIS — Z124 Encounter for screening for malignant neoplasm of cervix: Secondary | ICD-10-CM | POA: Diagnosis present

## 2020-04-22 LAB — CYTOLOGY - PAP
Comment: NEGATIVE
Diagnosis: UNDETERMINED — AB
High risk HPV: NEGATIVE

## 2020-05-16 IMAGING — CT CT ABD-PELV W/ CM
1 of 3 series · 12 of 32 positions shown, 17 images · IV contrast (iopamidol)
Comparison: Pelvic ultrasound April 01, 2017

CLINICAL DATA: Right lower quadrant pain

EXAM:
CT ABDOMEN AND PELVIS WITH CONTRAST
TECHNIQUE: Multidetector CT imaging of the abdomen and pelvis was performed
using the standard protocol following bolus administration of
intravenous contrast. Oral contrast was also administered.
CONTRAST:  100mL [id number] IOPAMIDOL ([id number]-OWW) INJECTION 61%

[Series 2: abd/pelvis w/cm · axial · 0.80mm/px · z∈[-454,-84]mm · 12 of 86 slices shown, 17 images]
[im 6/86  soft-tissue]
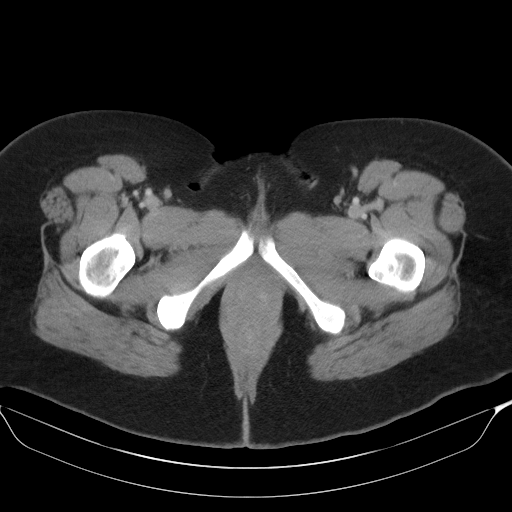
[im 6/86  bone]
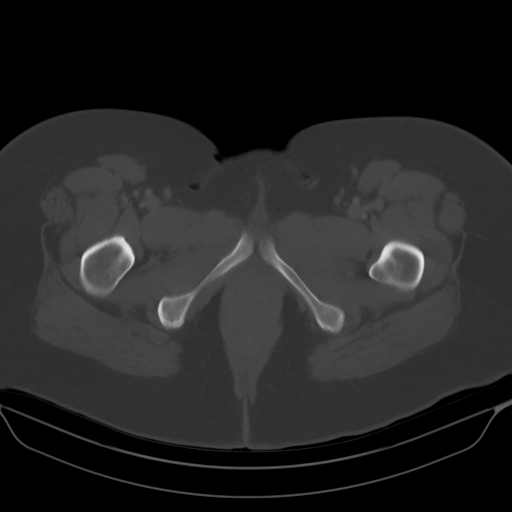
[im 16/86  soft-tissue]
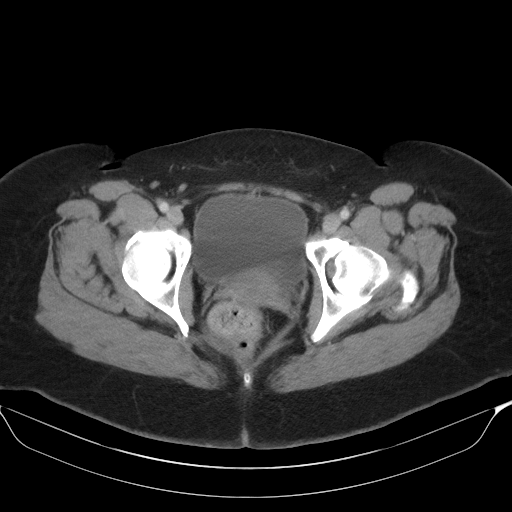
[im 22/86  soft-tissue]
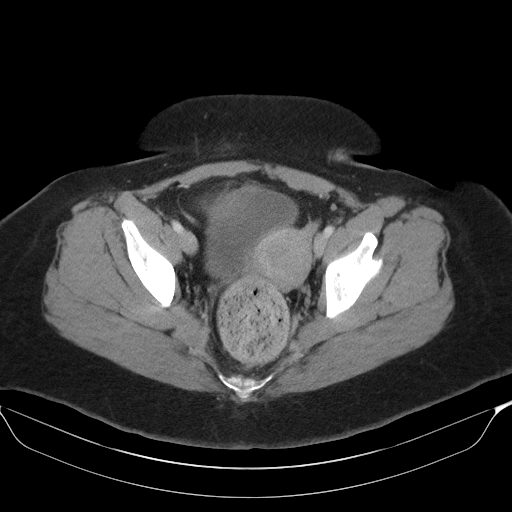
[im 27/86  soft-tissue]
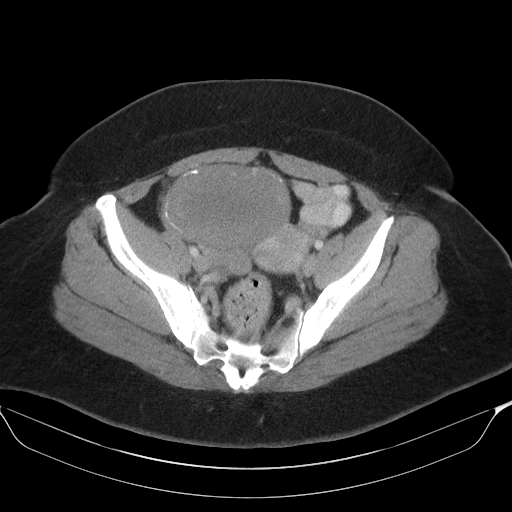
[im 38/86  soft-tissue]
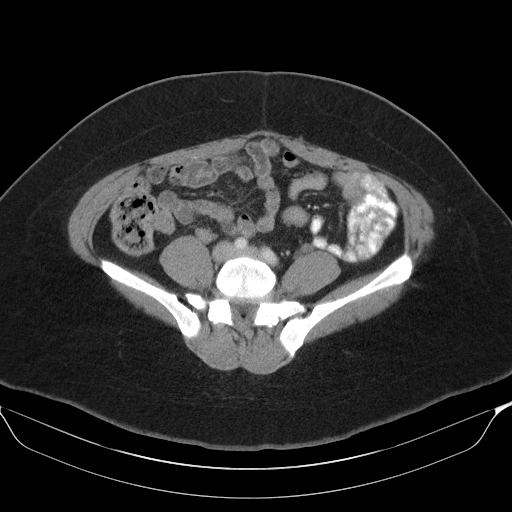
[im 43/86  soft-tissue]
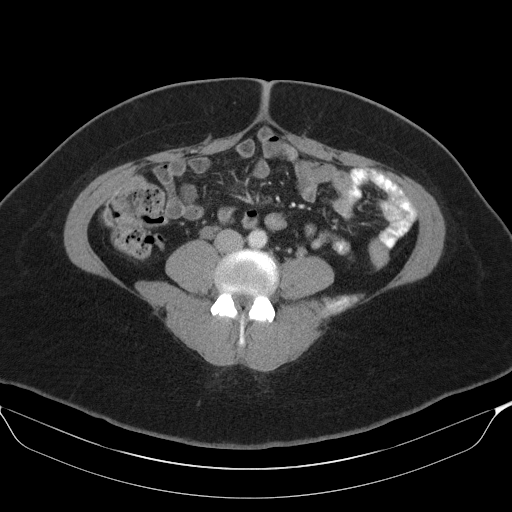
[im 48/86  soft-tissue]
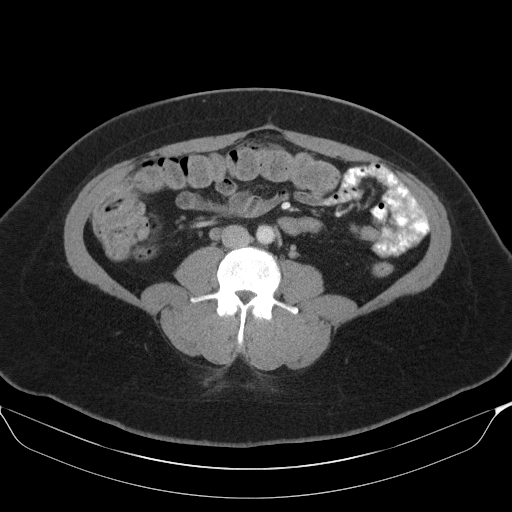
[im 59/86  soft-tissue]
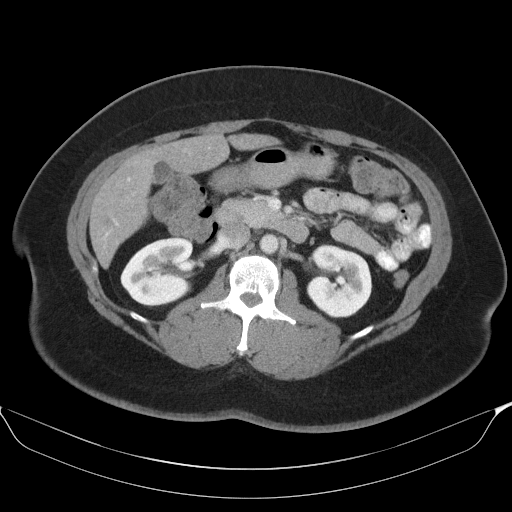
[im 64/86  soft-tissue]
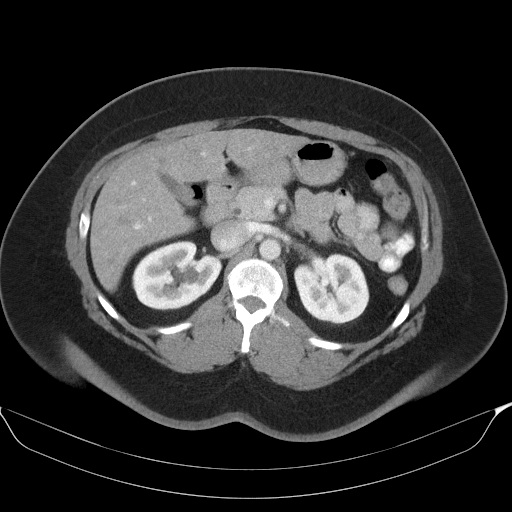
[im 64/86  lung]
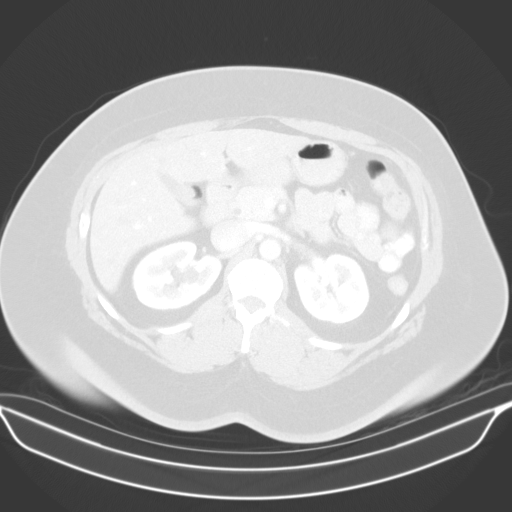
[im 64/86  bone]
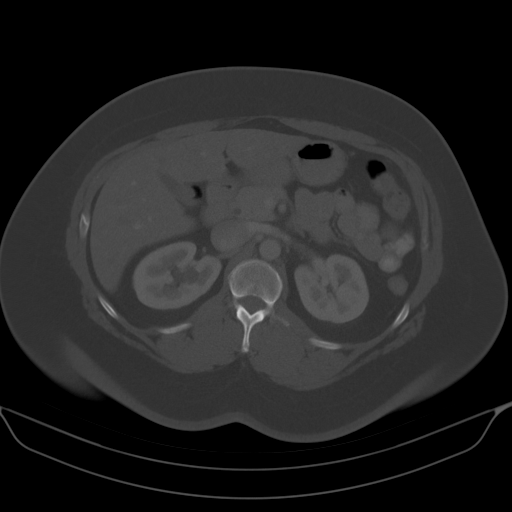
[im 70/86  soft-tissue]
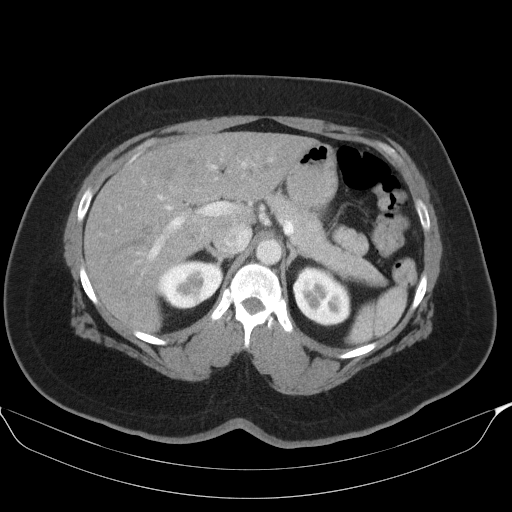
[im 70/86  lung]
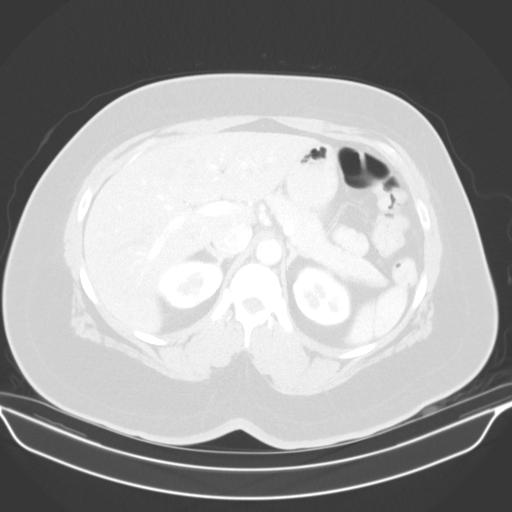
[im 75/86  lung]
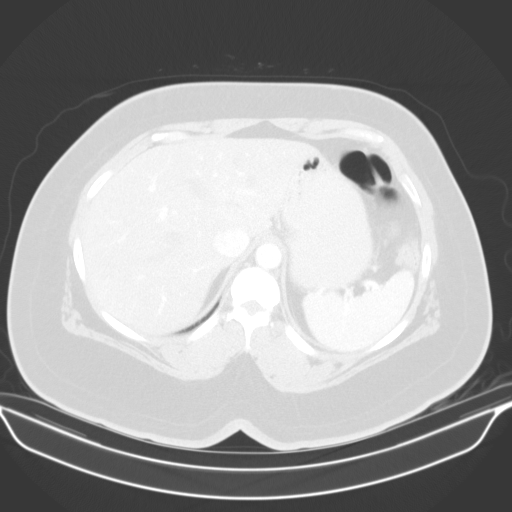
[im 80/86  soft-tissue]
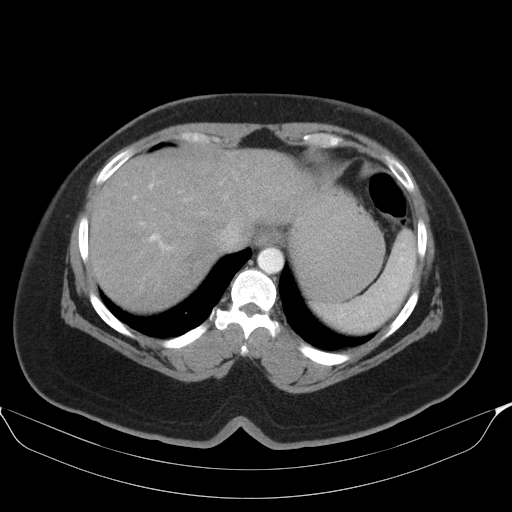
[im 80/86  lung]
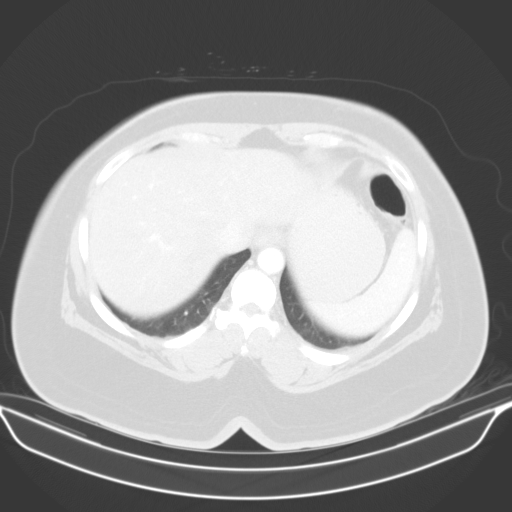

[12 of 32 positions shown; findings below may reference images not displayed]

FINDINGS: Lower chest: Lung bases are clear.

Hepatobiliary: There is hepatic steatosis. No focal liver lesions
are demonstrable. Gallbladder wall is not appreciably thickened.
There is no biliary duct dilatation.

Pancreas: No pancreatic mass or inflammatory focus.

Spleen: No splenic lesions are evident.

Adrenals/Urinary Tract: Adrenals bilaterally appear unremarkable.
There is a cyst in the mid right kidney measuring 1.7 x 1.6 cm.
There is no appreciable hydronephrosis on either side. There is no
evident renal or ureteral calculus on either side. Urinary bladder
is midline with wall thickness within normal limits.

Stomach/Bowel: Rectum is mildly distended with stool. There is no
rectal wall thickening. There is diffuse stool throughout the
remainder of the colon without appreciable colonic wall thickening.
There is no small bowel or gastric wall thickening. No mesenteric
thickening. No evident bowel obstruction. No free air or portal
venous air.

Vascular/Lymphatic: No evident abdominal aortic aneurysm. No
vascular lesions are evident. No adenopathy is appreciable in the
abdomen or pelvis.

Reproductive: Uterus is anteverted. There is mass with peripheral
calcification arising eccentrically from the uterus measuring 10.8 x
7.9 x 7.7 cm. This mass appears separate from the right ovary but
does abut the right fallopian tube. There is no appreciable
mesenteric edema in this area. No other pelvic masses evident.

Other: The appendix appears normal. No abscess or ascites is evident
abdomen or pelvis. There is a small ventral hernia containing only
fat.

Musculoskeletal: There are no blastic or lytic bone lesions. There
is moderate spinal stenosis at L4-5 due to diffuse disc protrusion
and bony hypertrophy. No intramuscular or abdominal wall lesion is
evident.
IMPRESSION: 1. Probable eccentric pedunculated leiomyoma in the anterior right
pelvis. This mass contains peripheral calcification. It measures
10.8 x 7.9 x 7.7 cm. It appears to arise eccentrically from the
uterus, although the connection with the uterus is not well
delineated on this study. It does appear separate from the right
ovary.

A lesion of this nature potentially could undergo torsion and cause
pain in the right abdomen. Clinical assessment in this regard
advised. Potentially Doppler ultrasound of the pelvis could be
helpful to assess for blood flow to this lesion.

2. Appendix appears normal. No abscess in the abdomen or pelvis. No
evident bowel obstruction. Note that there is fairly diffuse stool
throughout the colon.

3.  Hepatic steatosis.  No focal liver lesions evident.

4.  No evident renal or ureteral calculus.  No hydronephrosis.

5. There is a degree of spinal stenosis at L4-5 due to bony
hypertrophy and disc protrusion.

6.  Small ventral hernia containing only fat.

These results will be called to the ordering clinician or
representative by the [HOSPITAL] at the imaging location.

## 2020-11-06 ENCOUNTER — Telehealth: Payer: Self-pay | Admitting: Unknown Physician Specialty

## 2020-11-06 MED ORDER — NIRMATRELVIR/RITONAVIR (PAXLOVID)TABLET: 3.0000 | ORAL_TABLET | Freq: Two times a day (BID) | ORAL | 0 refills | Status: AC

## 2020-11-06 NOTE — Telephone Encounter (Signed)
Outpatient Oral COVID Treatment Note  I connected with Mariah Marks on 11/06/2020/3:49 PM by telephone and verified that I am speaking with the correct person using two identifiers.  I discussed the limitations, risks, security, and privacy concerns of performing an evaluation and management service by telephone and the availability of in person appointments. I also discussed with the patient that there may be a patient responsible charge related to this service. The patient expressed understanding and agreed to proceed.  Patient location: home Provider location: home  Diagnosis: COVID-19 infection  Purpose of visit: Discussion of potential use of Molnupiravir or Paxlovid, a new treatment for mild to moderate COVID-19 viral infection in non-hospitalized patients.   Subjective: Patient is a 50 y.o. female who has been diagnosed with COVID 19 viral infection.  Their symptoms began on 5/09 with URI symptoms.    Past Medical History:  Diagnosis Date  . Anemia   . Fibroid   . Hypertension     No Known Allergies   Current Outpatient Medications:  .  amLODipine (NORVASC) 10 MG tablet, , Disp: , Rfl:  .  cephALEXin (KEFLEX) 500 MG capsule, Take 1 capsule (500 mg total) by mouth 2 (two) times daily., Disp: 10 capsule, Rfl: 0 .  dibucaine (NUPERCAINAL) 1 % OINT, Place 1 application rectally as needed for hemorrhoids., Disp: 1 Tube, Rfl: 1 .  docusate sodium (COLACE) 100 MG capsule, Take 1 capsule (100 mg total) by mouth 2 (two) times daily., Disp: 20 capsule, Rfl: 1 .  hydrocortisone-pramoxine (PROCTOFOAM-HC) rectal foam, Place 1 applicator rectally 2 (two) times daily., Disp: 10 g, Rfl: 0 .  ibuprofen (ADVIL,MOTRIN) 600 MG tablet, Take 1 tablet (600 mg total) by mouth every 6 (six) hours., Disp: 30 tablet, Rfl: 0 .  iron polysaccharides (NIFEREX) 150 MG capsule, Take 1 capsule (150 mg total) by mouth daily., Disp: 30 capsule, Rfl: 2 .  methylPREDNISolone (MEDROL DOSEPAK) 4 MG TBPK tablet, 6  day dose pack - take as directed, Disp: 21 tablet, Rfl: 0 .  NIFEdipine (PROCARDIA-XL/ADALAT CC) 30 MG 24 hr tablet, Take 1 tablet (30 mg total) by mouth daily., Disp: 30 tablet, Rfl: 1 .  NONFORMULARY OR COMPOUNDED ITEM, See pharmacy note, Disp: 120 each, Rfl: 2 .  oxyCODONE-acetaminophen (PERCOCET/ROXICET) 5-325 MG tablet, Take 2 tablets by mouth every 4 (four) hours as needed (pain scale > 7)., Disp: 30 tablet, Rfl: 0 .  Prenatal Vit-Fe Fumarate-FA (PRENATAL MULTIVITAMIN) TABS tablet, Take 1 tablet by mouth daily at 12 noon., Disp: , Rfl:  .  witch hazel-glycerin (TUCKS) pad, Apply 1 application topically as needed for hemorrhoids., Disp: 40 each, Rfl: 12  Objective: Patient appears/sounds congested.  They are in no apparent distress.  Breathing is non labored.  Mood and behavior are normal.  Laboratory Data:  No results found for this or any previous visit (from the past 2160 hour(s)).   Assessment: 50 y.o. female with mild/moderate COVID 19 viral infection diagnosed on 5/09 at high risk for progression to severe COVID 19.  Plan:  This patient is a 50 y.o. female that meets the following criteria for Emergency Use Authorization of: Paxlovid 1. Age >12 yr AND > 40 kg 2. SARS-COV-2 positive test 3. Symptom onset < 5 days 4. Mild-to-moderate COVID disease with high risk for severe progression to hospitalization or death  I have spoken and communicated the following to the patient or parent/caregiver regarding: 1. Paxlovid is an unapproved drug that is authorized for use under an Emergency Use Authorization.  2. There are no adequate, approved, available products for the treatment of COVID-19 in adults who have mild-to-moderate COVID-19 and are at high risk for progressing to severe COVID-19, including hospitalization or death. 3. Other therapeutics are currently authorized. For additional information on all products authorized for treatment or prevention of COVID-19, please see  [email or web address]  4. There are benefits and risks of taking this treatment as outlined in the "Fact Sheet for Patients and Caregivers."  5. "Fact Sheet for Patients and Caregivers" was reviewed with patient. A hard copy will be provided to patient from pharmacy prior to the patient receiving treatment. 6. Patients should continue to self-isolate and use infection control measures (e.g., wear mask, isolate, social distance, avoid sharing personal items, clean and disinfect "high touch" surfaces, and frequent handwashing) according to CDC guidelines.  7. The patient or parent/caregiver has the option to accept or refuse treatment. 8. Patient medication history was reviewed for potential drug interactions:No drug interactions 9. Patient's GFR was calculated to be 84 (outside chart), and they were therefore prescribed Normal dose (GFR>60) - nirmatrelvir 150mg  tab (2 tablet) by mouth twice daily AND ritonavir 100mg  tab (1 tablet) by mouth twice daily   After reviewing above information with the patient, the patient agrees to receive Paxlovid.  Follow up instructions:    . Take prescription BID x 5 days as directed . Reach out to pharmacist for counseling on medication if desired . For concerns regarding further COVID symptoms please follow up with your PCP or urgent care . For urgent or life-threatening issues, seek care at your local emergency department  The patient was provided an opportunity to ask questions, and all were answered. The patient agreed with the plan and demonstrated an understanding of the instructions.   Script sent to [facility]  The patient was advised to call their PCP or seek an in-person evaluation if the symptoms worsen or if the condition fails to improve as anticipated.   I provided 20 minutes of non face-to-face telephone visit time during this encounter, and  > 50% was spent counseling as documented under my assessment & plan.  Kathrine Haddock, NP 11/06/2020 /3:49 PM

## 2021-11-05 ENCOUNTER — Other Ambulatory Visit: Payer: Self-pay | Admitting: Family Medicine

## 2021-11-05 DIAGNOSIS — R1011 Right upper quadrant pain: Secondary | ICD-10-CM

## 2022-04-26 ENCOUNTER — Emergency Department (HOSPITAL_BASED_OUTPATIENT_CLINIC_OR_DEPARTMENT_OTHER)
Admission: EM | Admit: 2022-04-26 | Discharge: 2022-04-26 | Disposition: A | Discharge status: Home / Self Care | Payer: Medicaid Other | Attending: Emergency Medicine | Admitting: Emergency Medicine

## 2022-04-26 ENCOUNTER — Other Ambulatory Visit: Payer: Self-pay

## 2022-04-26 ENCOUNTER — Emergency Department (HOSPITAL_BASED_OUTPATIENT_CLINIC_OR_DEPARTMENT_OTHER): Payer: Medicaid Other

## 2022-04-26 ENCOUNTER — Encounter (HOSPITAL_BASED_OUTPATIENT_CLINIC_OR_DEPARTMENT_OTHER): Payer: Self-pay

## 2022-04-26 DIAGNOSIS — N132 Hydronephrosis with renal and ureteral calculous obstruction: Secondary | ICD-10-CM | POA: Insufficient documentation

## 2022-04-26 DIAGNOSIS — I10 Essential (primary) hypertension: Secondary | ICD-10-CM | POA: Insufficient documentation

## 2022-04-26 DIAGNOSIS — Z79899 Other long term (current) drug therapy: Secondary | ICD-10-CM | POA: Insufficient documentation

## 2022-04-26 DIAGNOSIS — N200 Calculus of kidney: Secondary | ICD-10-CM

## 2022-04-26 LAB — COMPREHENSIVE METABOLIC PANEL
ALT: 17 U/L (ref 0–44)
AST: 16 U/L (ref 15–41)
Albumin: 4.4 g/dL (ref 3.5–5.0)
Alkaline Phosphatase: 79 U/L (ref 38–126)
Anion gap: 14 (ref 5–15)
BUN: 17 mg/dL (ref 6–20)
CO2: 23 mmol/L (ref 22–32)
Calcium: 10 mg/dL (ref 8.9–10.3)
Chloride: 102 mmol/L (ref 98–111)
Creatinine, Ser: 0.94 mg/dL (ref 0.44–1.00)
GFR, Estimated: >60 mL/min (ref >60)
Glucose, Bld: 119 mg/dL — ABNORMAL HIGH (ref 70–99)
Potassium: 4 mmol/L (ref 3.5–5.1)
Sodium: 139 mmol/L (ref 135–145)
Total Bilirubin: 0.4 mg/dL (ref 0.3–1.2)
Total Protein: 7.8 g/dL (ref 6.5–8.1)

## 2022-04-26 LAB — URINALYSIS, ROUTINE W REFLEX MICROSCOPIC
Bilirubin Urine: NEGATIVE
Glucose, UA: NEGATIVE mg/dL
Ketones, ur: 15 mg/dL — AB
Nitrite: NEGATIVE
Protein, ur: 30 mg/dL — AB
RBC / HPF: >50 RBC/hpf — ABNORMAL HIGH (ref 0–5)
Specific Gravity, Urine: 1.029 (ref 1.005–1.030)
pH: 5.5 (ref 5.0–8.0)

## 2022-04-26 LAB — CBC WITH DIFFERENTIAL/PLATELET
Abs Immature Granulocytes: 0.02 10*3/uL (ref 0.00–0.07)
Basophils Absolute: 0 10*3/uL (ref 0.0–0.1)
Basophils Relative: 0 %
Eosinophils Absolute: 0 10*3/uL (ref 0.0–0.5)
Eosinophils Relative: 0 %
HCT: 38 % (ref 36.0–46.0)
Hemoglobin: 12.6 g/dL (ref 12.0–15.0)
Immature Granulocytes: 0 %
Lymphocytes Relative: 17 %
Lymphs Abs: 1.3 10*3/uL (ref 0.7–4.0)
MCH: 27.1 pg (ref 26.0–34.0)
MCHC: 33.2 g/dL (ref 30.0–36.0)
MCV: 81.7 fL (ref 80.0–100.0)
Monocytes Absolute: 0.5 10*3/uL (ref 0.1–1.0)
Monocytes Relative: 6 %
Neutro Abs: 5.9 10*3/uL (ref 1.7–7.7)
Neutrophils Relative %: 77 %
Platelets: 303 10*3/uL (ref 150–400)
RBC: 4.65 MIL/uL (ref 3.87–5.11)
RDW: 13.5 % (ref 11.5–15.5)
WBC: 7.8 10*3/uL (ref 4.0–10.5)
nRBC: 0 % (ref 0.0–0.2)

## 2022-04-26 LAB — PREGNANCY, URINE: Preg Test, Ur: NEGATIVE

## 2022-04-26 MED ORDER — TAMSULOSIN HCL 0.4 MG PO CAPS: 0.4000 mg | ORAL_CAPSULE | Freq: Every day | ORAL | 0 refills | Status: AC

## 2022-04-26 MED ORDER — ONDANSETRON 4 MG PO TBDP: 4.0000 mg | ORAL_TABLET | Freq: Three times a day (TID) | ORAL | 0 refills | Status: AC | PRN

## 2022-04-26 MED ORDER — MORPHINE SULFATE (PF) 4 MG/ML IV SOLN
4.0000 mg | Freq: Once | INTRAVENOUS | Status: AC
  Administered 2022-04-26: 4 mg via INTRAVENOUS
  Filled 2022-04-26: qty 1

## 2022-04-26 MED ORDER — HYDROCODONE-ACETAMINOPHEN 5-325 MG PO TABS: 1.0000 | ORAL_TABLET | ORAL | 0 refills | Status: AC | PRN

## 2022-04-26 MED ORDER — ONDANSETRON HCL 4 MG/2ML IJ SOLN
4.0000 mg | Freq: Once | INTRAMUSCULAR | Status: AC
  Administered 2022-04-26: 4 mg via INTRAVENOUS
  Filled 2022-04-26: qty 2

## 2022-04-26 MED ORDER — SODIUM CHLORIDE 0.9 % IV BOLUS
1000.0000 mL | Freq: Once | INTRAVENOUS | Status: AC
  Administered 2022-04-26: 1000 mL via INTRAVENOUS

## 2022-04-26 NOTE — ED Triage Notes (Addendum)
Pt reports R sided flank pain and red urine x1 hour. No h/x kidney stones

## 2022-04-26 NOTE — Discharge Instructions (Signed)
You have been seen today for your complaint of kidney stone. Your lab work showed evidence of a kidney stone. Your imaging showed that you have a 2 mm kidney stone in your right kidney. Your discharge medications include Zofran.  This is a nausea medication.  You should take it as needed for nausea. Flomax.  This is a medication to help you pass the stone.  You should take it once daily until you do pass the stone. Norco.  This is an opioid pain medication.  You should take it as needed for pain.  You should not drink, drive or operate heavy machinery while taking this medication. Home care instructions are as follows:  You should drink a lot of fluids until the stone is passed. Follow up with: Your primary care provider in 1 week Please seek immediate medical care if you develop any of the following symptoms: You have a fever or chills. You develop severe pain. You develop new abdominal pain. You faint. You are unable to urinate. At this time there does not appear to be the presence of an emergent medical condition, however there is always the potential for conditions to change. Please read and follow the below instructions.  Do not take your medicine if  develop an itchy rash, swelling in your mouth or lips, or difficulty breathing; call 911 and seek immediate emergency medical attention if this occurs.  You may review your lab tests and imaging results in their entirety on your MyChart account.  Please discuss all results of fully with your primary care provider and other specialist at your follow-up visit.  Note: Portions of this text may have been transcribed using voice recognition software. Every effort was made to ensure accuracy; however, inadvertent computerized transcription errors may still be present.

## 2022-04-26 NOTE — ED Provider Notes (Signed)
[facility]ENC[facility] DEPT Provider Note   CSN: [id number] Arrival date & time: 04/26/22  1708     History  Chief Complaint  Patient presents with   Flank Pain    Mariah Marks is a 51 y.o. female.  With a history of hypertension, anemia, uterine fibroids who presents ED for evaluation of right-sided flank pain.  Pain started approximately 1 PM today and was sudden.  Pain radiates to the right side of her abdomen.  Also complaining of hematuria.  No history of kidney stones.  No fevers.  No dysuria, frequency or urgency.  No chest pain or shortness of breath.   Flank Pain       Home Medications Prior to Admission medications   Medication Sig Start Date End Date Taking? Authorizing Provider  amLODipine (NORVASC) 10 MG tablet  11/08/18   [provider]  cephALEXin (KEFLEX) 500 MG capsule Take 1 capsule (500 mg total) by mouth 2 (two) times daily. 12/05/15   Christophe Louis, MD  dibucaine (NUPERCAINAL) 1 % OINT Place 1 application rectally as needed for hemorrhoids. 11/30/15   Waymon Amato, MD  docusate sodium (COLACE) 100 MG capsule Take 1 capsule (100 mg total) by mouth 2 (two) times daily. 11/30/15   Waymon Amato, MD  hydrocortisone-pramoxine (PROCTOFOAM-HC) rectal foam Place 1 applicator rectally 2 (two) times daily. 12/05/15   Christophe Louis, MD  ibuprofen (ADVIL,MOTRIN) 600 MG tablet Take 1 tablet (600 mg total) by mouth every 6 (six) hours. 11/30/15   Waymon Amato, MD  iron polysaccharides (NIFEREX) 150 MG capsule Take 1 capsule (150 mg total) by mouth daily. 11/30/15   Waymon Amato, MD  methylPREDNISolone (MEDROL DOSEPAK) 4 MG TBPK tablet 6 day dose pack - take as directed 08/28/18   Edrick Kins, DPM  NIFEdipine (PROCARDIA-XL/ADALAT CC) 30 MG 24 hr tablet Take 1 tablet (30 mg total) by mouth daily. 12/05/15   Christophe Louis, MD  NONFORMULARY OR COMPOUNDED ITEM See pharmacy note 09/01/18   Edrick Kins, DPM  oxyCODONE-acetaminophen (PERCOCET/ROXICET) 5-325 MG tablet Take 2  tablets by mouth every 4 (four) hours as needed (pain scale > 7). 11/30/15   Waymon Amato, MD  Prenatal Vit-Fe Fumarate-FA (PRENATAL MULTIVITAMIN) TABS tablet Take 1 tablet by mouth daily at 12 noon.    [provider]  witch hazel-glycerin (TUCKS) pad Apply 1 application topically as needed for hemorrhoids. 11/30/15   Waymon Amato, MD      Allergies    Patient has no known allergies.    Review of Systems   Review of Systems  Genitourinary:  Positive for flank pain and hematuria.  All other systems reviewed and are negative.   Physical Exam Updated Vital Signs BP (!) 167/118   Pulse 92   Temp 97.8 F (36.6 C)   Resp 20   Ht '5\' 3"'$  (1.6 m)   Wt 115.2 kg   LMP  (LMP Unknown)   SpO2 100%   BMI 44.99 kg/m  Physical Exam Vitals and nursing note reviewed.  Constitutional:      General: She is in acute distress (Resting uncomfortably in bed).     Appearance: Normal appearance. She is normal weight. She is not ill-appearing.  HENT:     Head: Normocephalic and atraumatic.  Cardiovascular:     Rate and Rhythm: Normal rate and regular rhythm.     Pulses: Normal pulses.     Heart sounds: Normal heart sounds.  Pulmonary:     Effort: Pulmonary effort is normal.  No respiratory distress.     Breath sounds: Normal breath sounds.  Abdominal:     General: Abdomen is flat.     Tenderness: There is no abdominal tenderness. There is no right CVA tenderness or left CVA tenderness.  Musculoskeletal:        General: Normal range of motion.     Cervical back: Neck supple.  Skin:    General: Skin is warm and dry.     Capillary Refill: Capillary refill takes less than 2 seconds.  Neurological:     Mental Status: She is alert and oriented to person, place, and time.  Psychiatric:        Mood and Affect: Mood normal.        Behavior: Behavior normal.     ED Results / Procedures / Treatments   Labs (all labs ordered are listed, but only abnormal results are displayed) Labs Reviewed   URINALYSIS, ROUTINE W REFLEX MICROSCOPIC - Abnormal; Notable for the following components:      Result Value   Color, Urine ORANGE (*)    APPearance CLOUDY (*)    Hgb urine dipstick LARGE (*)    Ketones, ur 15 (*)    Protein, ur 30 (*)    Leukocytes,Ua SMALL (*)    RBC / HPF >50 (*)    Bacteria, UA RARE (*)    All other components within normal limits  COMPREHENSIVE METABOLIC PANEL - Abnormal; Notable for the following components:   Glucose, Bld 119 (*)    All other components within normal limits  PREGNANCY, URINE  CBC WITH DIFFERENTIAL/PLATELET    EKG None  Radiology CT Renal Stone Study  Result Date: 04/26/2022 CLINICAL DATA:  Right flank pain, hematuria EXAM: CT ABDOMEN AND PELVIS WITHOUT CONTRAST TECHNIQUE: Multidetector CT imaging of the abdomen and pelvis was performed following the standard protocol without IV contrast. RADIATION DOSE REDUCTION: This exam was performed according to the departmental dose-optimization program which includes automated exposure control, adjustment of the mA and/or kV according to patient size and/or use of iterative reconstruction technique. COMPARISON:  01/31/2018 FINDINGS: Lower chest: No acute abnormality. Hepatobiliary: No focal liver abnormality is seen. No gallstones, gallbladder wall thickening, or biliary dilatation. Pancreas: Unremarkable Spleen: Unremarkable Adrenals/Urinary Tract: The adrenal glands are unremarkable. The kidneys are normal in size and position. Mild right hydronephrosis and hydroureter to the level of the right ureterovesicular junction where an obstructing 2 mm calculus is seen. No additional nephro or urolithiasis. No hydronephrosis on the left. No perinephric fluid collections. The bladder is unremarkable. Stomach/Bowel: Small hiatal hernia. Stomach, small bowel, and large bowel are otherwise unremarkable. Appendix normal. No free intraperitoneal gas or fluid. Vascular/Lymphatic: No significant vascular findings are  present. No enlarged abdominal or pelvic lymph nodes. Reproductive: Rim calcified mass is again seen within the pelvis abutting the uterine fundus which is identical in size and demonstrates increasing peripheral calcification since prior examination likely representing a pedunculated uterine fibroid. This measures 7.4 x 10.3 x 7.0 cm in greatest dimension. The pelvic organs are otherwise unremarkable. Other: Tiny fat containing umbilical hernia. Musculoskeletal: Osseous structures are age-appropriate. No acute bone abnormality. No lytic or blastic bone lesion. IMPRESSION: 1. Obstructing 2 mm calculus at the right ureterovesicular junction resulting in mild right hydronephrosis. 2. Stable 10.3 cm probable rim calcified pedunculated uterine fibroid. Electronically Signed   By: Fidela Salisbury M.D.   On: 04/26/2022 19:23    Procedures Procedures    Medications Ordered in ED Medications  sodium chloride  0.9 % bolus 1,000 mL (has no administration in time range)  morphine (PF) 4 MG/ML injection 4 mg (has no administration in time range)    ED Course/ Medical Decision Making/ A&P Clinical Course as of 04/26/22 2152  Mon Apr 26, 2022  2118 Reevaluation shows patient's pain has greatly improved and she is ready to go home. [AS]    Clinical Course User Index [AS] Claudie Fisherman Grafton Folk, PA-C                           Medical Decision Making Amount and/or Complexity of Data Reviewed Labs: ordered.  Risk Prescription drug management.  This patient presents to the ED for concern of right flank pain, this involves an extensive number of treatment options, and is a complaint that carries with it a high risk of complications and morbidity. The differential diagnosis of emergent flank pain includes, but is not limited to :Abdominal aortic aneurysm,, Renal artery embolism,Renal vein thrombosis, Aortic dissection, Mesenteric ischemia, Pyelonephritis, Renal infarction, Renal hemorrhage, Nephrolithiasis/ Renal  Colic, Bladder tumor,Cystitis, Biliary colic, Pancreatitis Perforated peptic ulcer Appendicitis ,Inguinal Hernia, Diverticulitis, Bowel obstruction Ectopic Pregnancy,PID/TOA,Ovarian cyst, Ovarian torsion Shingles Lower lobe pneumonia, Retroperitoneal hematoma/abscess/tumor, Epidural abscess, Epidural hematoma   Co morbidities that complicate the patient evaluation  Hypertension, uterine fibroids, anemia  My initial workup includes  Additional history obtained from: Nursing notes from this visit.  I ordered, reviewed and interpreted labs which include: CMP, CBC, urinalysis, urine pregnancy urinalysis shows high red blood cells.  All other labs normal   I ordered imaging studies including CT stone study I independently visualized and interpreted imaging which showed metformin does not have diabetes 2 mm obstructing stone at the right UVJ I agree with the radiologist interpretation  Cardiac Monitoring:  The patient was maintained on a cardiac monitor.  I personally viewed and interpreted the cardiac monitored which showed an underlying rhythm of: NSR   Afebrile, hemodynamically stable.  Patient is a 51 year old female who presents to the ED for evaluation of sudden onset right flank pain.  Urinalysis shows hematuria without signs of obvious infection.  CT shows 2 mm obstructing stone in the right UVJ. patient has not had a fever.  Physical exam reveals patient that is in overall discomfort but not ill-appearing, and is otherwise unremarkable.  No leukocytosis.  I have low suspicion for infected stone at this time.  Patient was given IV fluids, Zofran and morphine.  Patient reported significant improvement in her pain.  Patient was given a strainer and heavily counseled on pushing fluids until the stone is passed.  She was given a prescription for Flomax, Norco, and Zofran.  Gave strict return precautions.  Stable at discharge.  At this time there does not appear to be any evidence of an acute  emergency medical condition and the patient appears stable for discharge with appropriate outpatient follow up. Diagnosis was discussed with patient who verbalizes understanding of care plan and is agreeable to discharge. I have discussed return precautions with patient who verbalizes understanding. Patient encouraged to follow-up with their PCP within 1 week. All questions answered.  Patient's case discussed with Dr. Armandina Gemma who agrees with plan to discharge with follow-up.   Note: Portions of this report may have been transcribed using voice recognition software. Every effort was made to ensure accuracy; however, inadvertent computerized transcription errors may still be present.          Final Clinical Impression(s) / ED Diagnoses  Final diagnoses:  None    Rx / DC Orders ED Discharge Orders     None         Nehemiah Massed 04/26/22 2157    Regan Lemming, MD 04/27/22 1501

## 2023-04-04 DIAGNOSIS — R5383 Other fatigue: Secondary | ICD-10-CM | POA: Diagnosis not present

## 2023-04-04 DIAGNOSIS — E7800 Pure hypercholesterolemia, unspecified: Secondary | ICD-10-CM | POA: Diagnosis not present

## 2023-04-04 DIAGNOSIS — E559 Vitamin D deficiency, unspecified: Secondary | ICD-10-CM | POA: Diagnosis not present

## 2023-04-07 DIAGNOSIS — I10 Essential (primary) hypertension: Secondary | ICD-10-CM | POA: Diagnosis not present

## 2023-04-07 DIAGNOSIS — R7309 Other abnormal glucose: Secondary | ICD-10-CM | POA: Diagnosis not present

## 2023-05-12 DIAGNOSIS — I10 Essential (primary) hypertension: Secondary | ICD-10-CM | POA: Diagnosis not present

## 2023-05-12 DIAGNOSIS — Z0000 Encounter for general adult medical examination without abnormal findings: Secondary | ICD-10-CM | POA: Diagnosis not present

## 2023-05-12 DIAGNOSIS — E559 Vitamin D deficiency, unspecified: Secondary | ICD-10-CM | POA: Diagnosis not present

## 2023-05-12 DIAGNOSIS — Z124 Encounter for screening for malignant neoplasm of cervix: Secondary | ICD-10-CM | POA: Diagnosis not present

## 2023-05-12 DIAGNOSIS — N3281 Overactive bladder: Secondary | ICD-10-CM | POA: Diagnosis not present

## 2023-05-12 DIAGNOSIS — Z1211 Encounter for screening for malignant neoplasm of colon: Secondary | ICD-10-CM | POA: Diagnosis not present

## 2023-05-12 DIAGNOSIS — L732 Hidradenitis suppurativa: Secondary | ICD-10-CM | POA: Diagnosis not present

## 2023-05-12 DIAGNOSIS — Z86018 Personal history of other benign neoplasm: Secondary | ICD-10-CM | POA: Diagnosis not present

## 2023-05-12 DIAGNOSIS — F419 Anxiety disorder, unspecified: Secondary | ICD-10-CM | POA: Diagnosis not present

## 2023-05-12 DIAGNOSIS — E7800 Pure hypercholesterolemia, unspecified: Secondary | ICD-10-CM | POA: Diagnosis not present

## 2023-05-12 DIAGNOSIS — Z01419 Encounter for gynecological examination (general) (routine) without abnormal findings: Secondary | ICD-10-CM | POA: Diagnosis not present

## 2023-05-25 DIAGNOSIS — N643 Galactorrhea not associated with childbirth: Secondary | ICD-10-CM | POA: Diagnosis not present

## 2023-06-27 ENCOUNTER — Inpatient Hospital Stay: Payer: Self-pay

## 2023-06-27 ENCOUNTER — Inpatient Hospital Stay: Payer: Self-pay | Admitting: Genetic Counselor

## 2023-07-01 ENCOUNTER — Telehealth: Payer: Self-pay | Admitting: Genetic Counselor

## 2023-07-01 NOTE — Telephone Encounter (Signed)
 Mariah Marks

## 2023-07-13 ENCOUNTER — Telehealth: Payer: Self-pay | Admitting: Genetic Counselor

## 2023-07-13 NOTE — Telephone Encounter (Signed)
 Made three attempts to contact patient to see if they wanted to be rescheduled for their Genetic Counseling appointments, per scheduling message. Left three voicemail's.

## 2023-07-21 ENCOUNTER — Institutional Professional Consult (permissible substitution) (HOSPITAL_BASED_OUTPATIENT_CLINIC_OR_DEPARTMENT_OTHER): Payer: Self-pay | Admitting: Family

## 2023-07-28 ENCOUNTER — Encounter (HOSPITAL_BASED_OUTPATIENT_CLINIC_OR_DEPARTMENT_OTHER): Payer: Self-pay
